# Patient Record
Sex: Female | Born: 1945
Health system: Southern US, Community
[De-identification: ages and names within clinical notes are randomized; demographics above are authoritative.]

## PROBLEM LIST (undated history)

## (undated) DIAGNOSIS — J449 Chronic obstructive pulmonary disease, unspecified: Secondary | ICD-10-CM

## (undated) DIAGNOSIS — E079 Disorder of thyroid, unspecified: Secondary | ICD-10-CM

## (undated) DIAGNOSIS — E039 Hypothyroidism, unspecified: Secondary | ICD-10-CM

## (undated) DIAGNOSIS — F419 Anxiety disorder, unspecified: Secondary | ICD-10-CM

## (undated) DIAGNOSIS — Z9109 Other allergy status, other than to drugs and biological substances: Secondary | ICD-10-CM

## (undated) DIAGNOSIS — J45909 Unspecified asthma, uncomplicated: Secondary | ICD-10-CM

## (undated) HISTORY — PX: NECK SURGERY: SHX720

---

## 2002-02-26 ENCOUNTER — Ambulatory Visit: Admission: RE | Admit: 2002-02-26 | Discharge: 2002-02-26 | Payer: Self-pay | Admitting: Family Medicine

## 2002-02-26 ENCOUNTER — Encounter: Payer: Self-pay | Admitting: Family Medicine

## 2002-07-30 ENCOUNTER — Other Ambulatory Visit: Admission: RE | Admit: 2002-07-30 | Discharge: 2002-07-30 | Payer: Self-pay | Admitting: *Deleted

## 2006-05-03 ENCOUNTER — Ambulatory Visit (HOSPITAL_COMMUNITY): Admission: RE | Admit: 2006-05-03 | Discharge: 2006-05-03 | Payer: Self-pay | Admitting: Family Medicine

## 2006-07-04 ENCOUNTER — Ambulatory Visit (HOSPITAL_COMMUNITY): Admission: RE | Admit: 2006-07-04 | Discharge: 2006-07-05 | Payer: Self-pay | Admitting: Neurosurgery

## 2008-07-22 ENCOUNTER — Ambulatory Visit (HOSPITAL_COMMUNITY): Admission: RE | Admit: 2008-07-22 | Discharge: 2008-07-22 | Payer: Self-pay | Admitting: Family Medicine

## 2010-08-07 ENCOUNTER — Encounter: Payer: Self-pay | Admitting: Otolaryngology

## 2010-08-07 ENCOUNTER — Encounter: Payer: Self-pay | Admitting: Family Medicine

## 2010-12-03 NOTE — Op Note (Signed)
NAMEAMOUR, TRIGG NO.:  [id number]   MEDICAL RECORD NO.:  [id number]          PATIENT TYPE:  OIB   LOCATION:  3033                         FACILITY:  [facility]   PHYSICIAN:  Danae Orleans. Venetia Maxon, M.D.  DATE OF BIRTH:  06/01/1947   DATE OF PROCEDURE:  07/04/2006  DATE OF DISCHARGE:                               OPERATIVE REPORT   PREOPERATIVE DIAGNOSIS:  Herniated cervical disk with spondylosis,  degenerative disk disease, stenosis and radiculopathy C4-5, C5-6, C6-7  levels.   POSTOPERATIVE DIAGNOSIS:  Herniated cervical disk with spondylosis,  degenerative disk disease, stenosis and radiculopathy C4-5, C5-6, C6-7  levels.   PROCEDURE:  Anterior cervical decompression and fusion C4-5, C5-6 and C6-  7 levels. Peak interbody cages, morselized bone autograft and Osteocel  and anterior cervical plate.   SURGEON:  Dr. Venetia Maxon   ASSISTANT:  Hewitt Shorts, M.D. and Poteat RN   ANESTHESIA:  General endotracheal anesthesia.   BLOOD LOSS:  Minimal.   COMPLICATIONS:  None.   DISPOSITION:  Recovery.   INDICATIONS:  Ann Fowler is a 65 year old woman with significant  cervical spondylosis and left greater than right foraminal stenosis at  C4-5, C5-6 and C6-7 levels with severe left arm pain.  It was elected to  take her surgery for anterior cervical decompression and fusion at these  affected levels.   PROCEDURE:  Ann Fowler was brought to the operating room.  Following  satisfactory and uncomplicated induction of general endotracheal  anesthesia placed intravenous lines, the patient was placed in supine  position on the operating table.  Her neck was placed in slight  extension.  She was placed in 10 pounds halter traction.  Her anterior  neck was then prepped and draped usual sterile fashion.  Area of plane  incision was infiltrated with quarter percent Marcaine and 0.5%  lidocaine with 1:200,000 epinephrine.  Incision was made midline  anterior border  sternocleidomastoid muscle on the left side of midline.  Subplatysmal dissection was performed and then blunt dissection was  utilized to keep the carotid sheath lateral and trachea, esophagus  medial exposing the anterior cervical spine.  A bent spinal needle was  placed at what was felt to be the C4-5 level.  This was confirmed on  intraoperative x-ray.  Subsequently longus colli muscles were taken down  from the anterior cervical spine from C4-C7 using electrocautery and  Keel elevator and the self-retaining shadowline retractors placed to  facilitate exposure.  The ventral osteophytes at 6-7 were removed with  Leksell rongeur.  The interspaces at each of the affected levels was  then incised with 15 blade and disk material was removed in piecemeal  fashion.  Endplates were decorticated initially with multiple curettes  and subsequently with high-speed drill and the bone removed was then  retained for use in later bone grafting.  Initially the C6-7 levels  decorticated along the uncinate spurs which were drilled out and  subsequently the C5-6 and C4-5 levels were similarly decompressed. The  disk space spreaders then placed microscope was brought into field and  using initially at the  C6-7 level.  The spinal cord dura and both C7  nerve roots widely decompressed extensive at the neural foramina.  Hemostasis was assured with Gelfoam soaked in thrombin.  After trial  sizing a 6-mm PEEK interbody cage was selected, packed with morcellized  bone autograft and retained from drilling the endplates and also mixed  with 1 mL ostia cell.  The implant was tamped into position countersunk  appropriately.  Attention was then turned to the C4-5 level similar  decompression was performed. Both the neural foramen and central spinal  cord dura were decompressed.  Given the fragility of C5 nerve roots care  was taken not to overly aggressively instrument the neural foramina but  these were decompressed  particularly on the left where there was  significant spondylitic material causing nerve root compression.  After  trial sizing a 6-mm PEEK interbody cage was selected, packed morselized  bone autograft and Osteocel tamped in position and at the C5-6 level  similar decompression was performed. Spinal cord dura and both C6 nerve  roots widely decompressed and similarly sized graft was placed. After  trial sizing a 48 mm anterior cervical plate was then selected and fixed  to the anterior cervical spine using variable angle 14 mm screws, two at  C4, two at C5, two at C6, and two at C7.  All screws had excellent  purchase and locking mechanisms were engaged.  Final x-ray demonstrated  well-positioned interbody grafts and anterior cervical plate.  The C5-6  interspace graft was slightly posterior.  This was brought forward after  re-engaging the graft loading tool and then another x-ray was not  obtained as it was not felt to be necessary. Hemostasis was then assured  and soft tissues were inspected and found to be in good repair.  A #7 JP  drain was placed through separate stab incision. The platysma layer was  closed with 3-0 Vicryl sutures and skin edges were approximated 3-0  Vicryl interrupted inverted sutures.  The wound was dressed with  Benzoin, Steri-Strips, Telfa gauze and tape.  The patient extubated in  operating room and taken to recovery room in stable satisfactory  condition having tolerated operation well.  Counts correct at the end of  case.      Danae Orleans. Venetia Maxon, M.D.  Electronically Signed     JDS/MEDQ  D:  07/04/2006  T:  07/04/2006  Job:  [id number]

## 2011-07-19 ENCOUNTER — Ambulatory Visit (HOSPITAL_COMMUNITY)
Admission: RE | Admit: 2011-07-19 | Discharge: 2011-07-19 | Disposition: A | Discharge status: Home / Self Care | Payer: Medicare Other | Attending: Psychiatry | Admitting: Psychiatry

## 2011-07-19 ENCOUNTER — Emergency Department (HOSPITAL_COMMUNITY): Payer: Medicare Other

## 2011-07-19 ENCOUNTER — Emergency Department (HOSPITAL_COMMUNITY)
Admission: EM | Admit: 2011-07-19 | Discharge: 2011-07-20 | Disposition: A | Discharge status: Home / Self Care | Payer: Medicare Other | Attending: Emergency Medicine | Admitting: Emergency Medicine

## 2011-07-19 DIAGNOSIS — F411 Generalized anxiety disorder: Secondary | ICD-10-CM | POA: Insufficient documentation

## 2011-07-19 DIAGNOSIS — J3489 Other specified disorders of nose and nasal sinuses: Secondary | ICD-10-CM | POA: Diagnosis not present

## 2011-07-19 DIAGNOSIS — F419 Anxiety disorder, unspecified: Secondary | ICD-10-CM

## 2011-07-19 DIAGNOSIS — F39 Unspecified mood [affective] disorder: Secondary | ICD-10-CM | POA: Diagnosis not present

## 2011-07-19 DIAGNOSIS — R443 Hallucinations, unspecified: Secondary | ICD-10-CM | POA: Insufficient documentation

## 2011-07-19 DIAGNOSIS — F29 Unspecified psychosis not due to a substance or known physiological condition: Secondary | ICD-10-CM | POA: Insufficient documentation

## 2011-07-19 HISTORY — DX: Anxiety disorder, unspecified: F41.9

## 2011-07-19 HISTORY — DX: Other allergy status, other than to drugs and biological substances: Z91.09

## 2011-07-19 HISTORY — DX: Disorder of thyroid, unspecified: E07.9

## 2011-07-19 LAB — BASIC METABOLIC PANEL
BUN: 14 mg/dL (ref 6–23)
Calcium: 9.6 mg/dL (ref 8.4–10.5)
GFR calc Af Amer: 86 mL/min — ABNORMAL LOW (ref >90)
GFR calc non Af Amer: 75 mL/min — ABNORMAL LOW (ref >90)
Glucose, Bld: 105 mg/dL — ABNORMAL HIGH (ref 70–99)
Potassium: 3.5 mEq/L (ref 3.5–5.1)

## 2011-07-19 LAB — CBC
MCH: 31.9 pg (ref 26.0–34.0)
MCHC: 33.7 g/dL (ref 30.0–36.0)
MCV: 94.7 fL (ref 78.0–100.0)
Platelets: 311 10*3/uL (ref 150–400)
RBC: 4.76 MIL/uL (ref 3.87–5.11)
RDW: 13.5 % (ref 11.5–15.5)

## 2011-07-19 LAB — DIFFERENTIAL
Basophils Absolute: 0.1 10*3/uL (ref 0.0–0.1)
Basophils Relative: 0 % (ref 0–1)
Eosinophils Absolute: 0.2 10*3/uL (ref 0.0–0.7)
Eosinophils Relative: 1 % (ref 0–5)
Lymphs Abs: 2.2 10*3/uL (ref 0.7–4.0)
Neutrophils Relative %: 70 % (ref 43–77)

## 2011-07-19 LAB — RAPID URINE DRUG SCREEN, HOSP PERFORMED
Cocaine: NOT DETECTED
Opiates: NOT DETECTED
Tetrahydrocannabinol: NOT DETECTED

## 2011-07-19 MED ORDER — AZITHROMYCIN 250 MG PO TABS: ORAL_TABLET | ORAL | Status: DC

## 2011-07-19 MED ORDER — ALPRAZOLAM 1 MG PO TABS: 1.0000 mg | ORAL_TABLET | Freq: Every evening | ORAL | Status: AC | PRN

## 2011-07-19 MED ORDER — FLUTICASONE PROPIONATE 50 MCG/ACT NA SUSP: 2.0000 | Freq: Every day | NASAL | Status: DC

## 2011-07-19 NOTE — ED Notes (Signed)
Pt transferred from TCU 

## 2011-07-19 NOTE — ED Notes (Signed)
Pt states she is having issues with anxiety, ears ring and feel full, feels like she is hearing voices, shocking feeling at intervals, voices are not telling pt to hurt self, thought she heard foot steps upstairs, tried to wake husband, pt was afraid so she went outside and ran across yards and into woods, event this am.

## 2011-07-19 NOTE — ED Notes (Signed)
Pt coming from [facility], needs med clearance and ACT team to see

## 2011-07-19 NOTE — ED Provider Notes (Signed)
History     CSN: [id number]  Arrival date & time 07/19/11  1548   First MD Initiated Contact with Patient 07/19/11 1854     HPI  Patient reports her voice for last 2 months. Reports her voice is loud and clear and will tell her to do things. Denies however telling acute violent things. Denies suicidal ideation or homicidal ideation. Patient reports she's been under significant amount of stress over the last 3 years. As well as anxiety. Denies history of hallucinations or mental disorder. Denies family history of behavioral disorders. Denies family history of Alzheimer's or dementia. Denies substance abuse besides prescription Xanax. Does request a prescription Xanax since she is almost out of her Xanax. Also requests a Z-Pak do to a sinus infection for one month. Reports sinus infection appears to be left-sided. Describes infection as pressure type headaches, nasal congestion, and rhinorrhea. Denies fever, cough, sore throat.    Past Medical History  Diagnosis Date  . Anxiety   . Thyroid disease   . Allergy to environmental factors     Past Surgical History  Procedure Date  . Neck surgery     No family history on file.  History  Substance Use Topics  . Smoking status: Never Smoker   . Smokeless tobacco: Not on file  . Alcohol Use: Yes    OB History    Grav Para Term Preterm Abortions TAB SAB Ect Mult Living                  Review of Systems  Constitutional: Negative for fever.  HENT: Positive for congestion, rhinorrhea and sinus pressure. Negative for sore throat.   Respiratory: Negative for cough and shortness of breath.   Cardiovascular: Negative for chest pain.  Psychiatric/Behavioral: Positive for hallucinations and behavioral problems. Negative for suicidal ideas and self-injury.  All other systems reviewed and are negative.    Allergies  Review of patient's allergies indicates no known allergies.  Home Medications   Current Outpatient Rx  Name Route Sig  Dispense Refill  . ALPRAZOLAM 1 MG PO TABS Oral Take 1 mg by mouth at bedtime.      Marland Kitchen BIOTIN 5 MG PO CAPS Oral Take 2 capsules by mouth daily.      Marland Kitchen VITAMIN D 1000 UNITS PO TABS Oral Take 5,000 Units by mouth daily.      . CYANOCOBALAMIN IJ Injection Inject 1,000 mcg as directed every 30 (thirty) days.      Marland Kitchen DIPHENHYDRAMINE HCL 25 MG PO TABS Oral Take 50 mg by mouth at bedtime.      Marland Kitchen LEVOTHYROXINE SODIUM 88 MCG PO TABS Oral Take 88 mcg by mouth daily.      . CENTRUM PO Oral Take 1 tablet by mouth daily.      Marland Kitchen OVER THE COUNTER MEDICATION Oral Take 2 tablets by mouth at bedtime.        BP 113/90  Pulse 85  Temp(Src) 99.4 F (37.4 C) (Oral)  Resp 16  SpO2 97%  Physical Exam  Vitals reviewed. Constitutional: She is oriented to person, place, and time. Vital signs are normal. She appears well-developed and well-nourished.  HENT:  Head: Normocephalic and atraumatic.  Right Ear: External ear normal.  Left Ear: External ear normal.  Nose: Nose normal.  Mouth/Throat: Oropharynx is clear and moist. No oropharyngeal exudate.  Eyes: Conjunctivae are normal. Pupils are equal, round, and reactive to light.  Neck: Normal range of motion. Neck supple.  Cardiovascular: Normal rate,  regular rhythm and normal heart sounds.  Exam reveals no friction rub.   No murmur heard. Pulmonary/Chest: Effort normal and breath sounds normal. She has no wheezes. She has no rhonchi. She has no rales. She exhibits no tenderness.  Musculoskeletal: Normal range of motion.  Neurological: She is alert and oriented to person, place, and time. Coordination normal.  Skin: Skin is warm and dry. No rash noted. No erythema. No pallor.  Psychiatric: Her behavior is normal.    ED Course  Procedures (including critical care time)  Labs Reviewed  BASIC METABOLIC PANEL - Abnormal; Notable for the following:    Glucose, Bld 105 (*)    GFR calc non Af Amer 75 (*)    GFR calc Af Amer 86 (*)    All other components  within normal limits  CBC - Abnormal; Notable for the following:    WBC 11.8 (*)    Hemoglobin 15.2 (*)    All other components within normal limits  DIFFERENTIAL - Abnormal; Notable for the following:    Neutro Abs 8.3 (*)    All other components within normal limits  URINE RAPID DRUG SCREEN (HOSP PERFORMED) - Abnormal; Notable for the following:    Benzodiazepines POSITIVE (*)    Barbiturates POSITIVE (*)    All other components within normal limits  ACETAMINOPHEN LEVEL  ETHANOL   Ct Head Wo Contrast  07/19/2011  *RADIOLOGY REPORT*  Clinical Data: New onset of hallucinations.  CT HEAD WITHOUT CONTRAST  Technique:  Contiguous axial images were obtained from the base of the skull through the vertex without contrast.  Comparison: None.  Findings: There is no intra or extra-axial fluid collection or mass lesion.  The basilar cisterns and ventricles have a normal appearance.  There is no CT evidence for acute infarction or hemorrhage.  Bone windows show no calvarial fracture or sinus abnormality.  IMPRESSION: Negative exam.  Original Report Authenticated By: Patterson Hammersmith, M.D.     No diagnosis found.    MDM   Discuss outpatient psychiatric referrals with patient and family. However patient was recommended to have tele psych despite being medically cleared here in the ED. Discussed that this may be a few more hours waitt. Patient and family understand and agree with this.        Thomasene Lot, [place] 07/19/11 2108
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
## 2011-07-19 NOTE — ED Provider Notes (Signed)
Medical screening examination/treatment/procedure(s) were performed by non-physician practitioner and as supervising physician I was immediately available for consultation/collaboration.    Patient resting comfortably.  Is awaiting telepsyh. consult.  Nelia Shi, MD 07/19/11 2238
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
## 2011-07-20 LAB — T4, FREE: Free T4: 1.38 ng/dL (ref 0.80–1.80)

## 2011-07-20 MED ORDER — PANTOPRAZOLE SODIUM 40 MG PO TBEC
40.0000 mg | DELAYED_RELEASE_TABLET | Freq: Once | ORAL | Status: AC
  Administered 2011-07-20: 40 mg via ORAL
  Filled 2011-07-20: qty 1

## 2011-07-20 MED ORDER — QUETIAPINE FUMARATE 25 MG PO TABS: 25.0000 mg | ORAL_TABLET | Freq: Every day | ORAL | Status: AC

## 2011-07-20 MED ORDER — CALCIUM CARBONATE ANTACID 500 MG PO CHEW
1.0000 | CHEWABLE_TABLET | Freq: Once | ORAL | Status: AC
  Administered 2011-07-20: 200 mg via ORAL
  Filled 2011-07-20: qty 1

## 2011-07-20 MED ORDER — ALPRAZOLAM 1 MG PO TABS
1.0000 mg | ORAL_TABLET | Freq: Once | ORAL | Status: AC
  Administered 2011-07-20: 1 mg via ORAL
  Filled 2011-07-20: qty 1

## 2011-07-20 MED ORDER — ESCITALOPRAM OXALATE 10 MG PO TABS: ORAL_TABLET | ORAL | Status: DC

## 2011-07-20 NOTE — ED Provider Notes (Signed)
Medical screening examination/treatment/procedure(s) were performed by non-physician practitioner and as supervising physician I was immediately available for consultation/collaboration.    Danaisha Celli L Ceola Para, MD 07/20/11 1325 

## 2011-07-20 NOTE — ED Notes (Signed)
Pt asks repeatedly whether the doctor is coming to discharge her. Writer has explained multiple times that psychiatric consult will be faxed with recommendations to ED doctor. Astronomer for delay. EDP is aware of pt requests and is anticipating fax.

## 2011-07-20 NOTE — ED Provider Notes (Addendum)
Signed out by Dr Radford Pax that pt likley can be discharged home after telepsych consult.  telepsych consult completed, psych MD recommends d/c home with rx for seroquel 25 mg qhs and lexapro 10 mg qam. Pt awake and alert. Normal mood/affect. No hallucinations/delusions. States is ready to go home, optimistic regarding plan.   Suzi Roots, MD 07/20/11 1610  Suzi Roots, MD 07/20/11 [phone]

## 2011-07-21 DIAGNOSIS — F29 Unspecified psychosis not due to a substance or known physiological condition: Secondary | ICD-10-CM | POA: Diagnosis not present

## 2011-07-21 DIAGNOSIS — F39 Unspecified mood [affective] disorder: Secondary | ICD-10-CM | POA: Diagnosis not present

## 2011-08-04 DIAGNOSIS — T1500XA Foreign body in cornea, unspecified eye, initial encounter: Secondary | ICD-10-CM | POA: Diagnosis not present

## 2011-08-04 DIAGNOSIS — H2510 Age-related nuclear cataract, unspecified eye: Secondary | ICD-10-CM | POA: Diagnosis not present

## 2011-08-04 DIAGNOSIS — G43109 Migraine with aura, not intractable, without status migrainosus: Secondary | ICD-10-CM | POA: Diagnosis not present

## 2011-09-02 DIAGNOSIS — E039 Hypothyroidism, unspecified: Secondary | ICD-10-CM | POA: Diagnosis not present

## 2011-09-02 DIAGNOSIS — L659 Nonscarring hair loss, unspecified: Secondary | ICD-10-CM | POA: Diagnosis not present

## 2011-09-14 DIAGNOSIS — J3089 Other allergic rhinitis: Secondary | ICD-10-CM | POA: Diagnosis not present

## 2011-09-14 DIAGNOSIS — B999 Unspecified infectious disease: Secondary | ICD-10-CM | POA: Diagnosis not present

## 2011-09-14 DIAGNOSIS — J45909 Unspecified asthma, uncomplicated: Secondary | ICD-10-CM | POA: Diagnosis not present

## 2011-09-14 DIAGNOSIS — J019 Acute sinusitis, unspecified: Secondary | ICD-10-CM | POA: Diagnosis not present

## 2011-10-02 DIAGNOSIS — J019 Acute sinusitis, unspecified: Secondary | ICD-10-CM | POA: Diagnosis not present

## 2011-10-02 DIAGNOSIS — R197 Diarrhea, unspecified: Secondary | ICD-10-CM | POA: Diagnosis not present

## 2011-10-07 DIAGNOSIS — N814 Uterovaginal prolapse, unspecified: Secondary | ICD-10-CM | POA: Diagnosis not present

## 2011-10-07 DIAGNOSIS — E039 Hypothyroidism, unspecified: Secondary | ICD-10-CM | POA: Diagnosis not present

## 2011-10-10 DIAGNOSIS — R351 Nocturia: Secondary | ICD-10-CM | POA: Diagnosis not present

## 2011-10-10 DIAGNOSIS — R339 Retention of urine, unspecified: Secondary | ICD-10-CM | POA: Diagnosis not present

## 2011-10-10 DIAGNOSIS — N8111 Cystocele, midline: Secondary | ICD-10-CM | POA: Diagnosis not present

## 2011-11-23 DIAGNOSIS — R5383 Other fatigue: Secondary | ICD-10-CM | POA: Diagnosis not present

## 2011-11-23 DIAGNOSIS — R5381 Other malaise: Secondary | ICD-10-CM | POA: Diagnosis not present

## 2011-11-23 DIAGNOSIS — L658 Other specified nonscarring hair loss: Secondary | ICD-10-CM | POA: Diagnosis not present

## 2011-11-25 DIAGNOSIS — J069 Acute upper respiratory infection, unspecified: Secondary | ICD-10-CM | POA: Diagnosis not present

## 2011-11-25 DIAGNOSIS — J309 Allergic rhinitis, unspecified: Secondary | ICD-10-CM | POA: Diagnosis not present

## 2012-02-20 DIAGNOSIS — S92309A Fracture of unspecified metatarsal bone(s), unspecified foot, initial encounter for closed fracture: Secondary | ICD-10-CM | POA: Diagnosis not present

## 2012-02-20 DIAGNOSIS — M79609 Pain in unspecified limb: Secondary | ICD-10-CM | POA: Diagnosis not present

## 2012-03-12 DIAGNOSIS — S92309A Fracture of unspecified metatarsal bone(s), unspecified foot, initial encounter for closed fracture: Secondary | ICD-10-CM | POA: Diagnosis not present

## 2012-04-30 DIAGNOSIS — S90129A Contusion of unspecified lesser toe(s) without damage to nail, initial encounter: Secondary | ICD-10-CM | POA: Diagnosis not present

## 2012-05-14 DIAGNOSIS — Z23 Encounter for immunization: Secondary | ICD-10-CM | POA: Diagnosis not present

## 2012-05-21 DIAGNOSIS — IMO0002 Reserved for concepts with insufficient information to code with codable children: Secondary | ICD-10-CM | POA: Diagnosis not present

## 2012-05-21 DIAGNOSIS — E039 Hypothyroidism, unspecified: Secondary | ICD-10-CM | POA: Diagnosis not present

## 2012-05-21 DIAGNOSIS — S93409A Sprain of unspecified ligament of unspecified ankle, initial encounter: Secondary | ICD-10-CM | POA: Diagnosis not present

## 2012-06-06 ENCOUNTER — Ambulatory Visit (HOSPITAL_COMMUNITY)
Admission: RE | Admit: 2012-06-06 | Discharge: 2012-06-06 | Disposition: A | Discharge status: Home / Self Care | Payer: Medicare Other | Source: Ambulatory Visit | Attending: Internal Medicine | Admitting: Internal Medicine

## 2012-06-06 ENCOUNTER — Other Ambulatory Visit (HOSPITAL_COMMUNITY): Payer: Self-pay | Admitting: Internal Medicine

## 2012-06-06 DIAGNOSIS — S93429A Sprain of deltoid ligament of unspecified ankle, initial encounter: Secondary | ICD-10-CM

## 2012-06-06 DIAGNOSIS — S93409A Sprain of unspecified ligament of unspecified ankle, initial encounter: Secondary | ICD-10-CM | POA: Diagnosis not present

## 2012-06-06 DIAGNOSIS — X58XXXA Exposure to other specified factors, initial encounter: Secondary | ICD-10-CM | POA: Insufficient documentation

## 2012-08-06 ENCOUNTER — Other Ambulatory Visit: Payer: Self-pay | Admitting: Internal Medicine

## 2012-08-06 DIAGNOSIS — Z1231 Encounter for screening mammogram for malignant neoplasm of breast: Secondary | ICD-10-CM

## 2012-09-19 ENCOUNTER — Ambulatory Visit
Admission: RE | Admit: 2012-09-19 | Discharge: 2012-09-19 | Disposition: A | Discharge status: Home / Self Care | Payer: Medicare Other | Source: Ambulatory Visit | Attending: Internal Medicine | Admitting: Internal Medicine

## 2012-09-19 DIAGNOSIS — Z1231 Encounter for screening mammogram for malignant neoplasm of breast: Secondary | ICD-10-CM

## 2013-06-20 ENCOUNTER — Emergency Department (HOSPITAL_COMMUNITY)
Admission: EM | Admit: 2013-06-20 | Discharge: 2013-06-20 | Disposition: A | Discharge status: Home / Self Care | Payer: Medicare Other | Attending: Emergency Medicine | Admitting: Emergency Medicine

## 2013-06-20 ENCOUNTER — Encounter (HOSPITAL_COMMUNITY): Payer: Self-pay | Admitting: Emergency Medicine

## 2013-06-20 ENCOUNTER — Emergency Department (HOSPITAL_COMMUNITY): Payer: Medicare Other

## 2013-06-20 DIAGNOSIS — J209 Acute bronchitis, unspecified: Secondary | ICD-10-CM

## 2013-06-20 DIAGNOSIS — E079 Disorder of thyroid, unspecified: Secondary | ICD-10-CM | POA: Insufficient documentation

## 2013-06-20 DIAGNOSIS — Z79899 Other long term (current) drug therapy: Secondary | ICD-10-CM | POA: Insufficient documentation

## 2013-06-20 DIAGNOSIS — F411 Generalized anxiety disorder: Secondary | ICD-10-CM | POA: Insufficient documentation

## 2013-06-20 DIAGNOSIS — IMO0002 Reserved for concepts with insufficient information to code with codable children: Secondary | ICD-10-CM | POA: Insufficient documentation

## 2013-06-20 LAB — BASIC METABOLIC PANEL
CO2: 28 mEq/L (ref 19–32)
Chloride: 100 mEq/L (ref 96–112)
Creatinine, Ser: 0.73 mg/dL (ref 0.50–1.10)
Glucose, Bld: 128 mg/dL — ABNORMAL HIGH (ref 70–99)
Sodium: 140 mEq/L (ref 135–145)

## 2013-06-20 LAB — CBC WITH DIFFERENTIAL/PLATELET
Basophils Absolute: 0 10*3/uL (ref 0.0–0.1)
HCT: 48.6 % — ABNORMAL HIGH (ref 36.0–46.0)
Lymphocytes Relative: 20 % (ref 12–46)
Lymphs Abs: 2.3 10*3/uL (ref 0.7–4.0)
MCV: 94.6 fL (ref 78.0–100.0)
Monocytes Absolute: 1 10*3/uL (ref 0.1–1.0)
Neutro Abs: 7.6 10*3/uL (ref 1.7–7.7)
RBC: 5.14 MIL/uL — ABNORMAL HIGH (ref 3.87–5.11)
RDW: 13.4 % (ref 11.5–15.5)
WBC: 11.5 10*3/uL — ABNORMAL HIGH (ref 4.0–10.5)

## 2013-06-20 MED ORDER — METHYLPREDNISOLONE SODIUM SUCC 125 MG IJ SOLR
125.0000 mg | Freq: Once | INTRAMUSCULAR | Status: AC
  Administered 2013-06-20: 125 mg via INTRAVENOUS
  Filled 2013-06-20: qty 2

## 2013-06-20 MED ORDER — ALBUTEROL SULFATE HFA 108 (90 BASE) MCG/ACT IN AERS: 2.0000 | INHALATION_SPRAY | RESPIRATORY_TRACT | Status: DC | PRN

## 2013-06-20 MED ORDER — SODIUM CHLORIDE 0.9 % IV SOLN
INTRAVENOUS | Status: DC
  Administered 2013-06-20: 06:00:00 via INTRAVENOUS

## 2013-06-20 MED ORDER — IPRATROPIUM BROMIDE 0.02 % IN SOLN
0.5000 mg | Freq: Once | RESPIRATORY_TRACT | Status: AC
  Administered 2013-06-20: 0.5 mg via RESPIRATORY_TRACT
  Filled 2013-06-20: qty 2.5

## 2013-06-20 MED ORDER — LEVOFLOXACIN 500 MG PO TABS: 500.0000 mg | ORAL_TABLET | Freq: Every day | ORAL | Status: DC

## 2013-06-20 MED ORDER — METHYLPREDNISOLONE SODIUM SUCC 125 MG IJ SOLR: 125.0000 mg | Freq: Once | INTRAMUSCULAR | Status: DC

## 2013-06-20 MED ORDER — ALBUTEROL SULFATE (5 MG/ML) 0.5% IN NEBU
5.0000 mg | INHALATION_SOLUTION | Freq: Once | RESPIRATORY_TRACT | Status: AC
  Administered 2013-06-20: 5 mg via RESPIRATORY_TRACT
  Filled 2013-06-20: qty 1

## 2013-06-20 MED ORDER — ALBUTEROL (5 MG/ML) CONTINUOUS INHALATION SOLN
10.0000 mg/h | INHALATION_SOLUTION | Freq: Once | RESPIRATORY_TRACT | Status: AC
  Administered 2013-06-20: 10 mg/h via RESPIRATORY_TRACT
  Filled 2013-06-20: qty 20

## 2013-06-20 MED ORDER — PREDNISONE 50 MG PO TABS: 50.0000 mg | ORAL_TABLET | Freq: Every day | ORAL | Status: DC

## 2013-06-20 MED ORDER — DOXYCYCLINE HYCLATE 100 MG PO CAPS: 100.0000 mg | ORAL_CAPSULE | Freq: Two times a day (BID) | ORAL | Status: DC

## 2013-06-20 NOTE — ED Notes (Signed)
Patient c/o shortness of breath since yesterday, but states has gotten worse during the night.

## 2013-06-20 NOTE — ED Provider Notes (Signed)
CSN: [id number]     Arrival date & time 06/20/13  0543 History   First MD Initiated Contact with Patient 06/20/13 [phone]     Chief Complaint  Patient presents with  . Shortness of Breath  . Cough   (Consider location/radiation/quality/duration/timing/severity/associated sxs/prior Treatment) HPI  Patient reports she started getting short of breath yesterday with wheezing. She states she has had a cough and some green sputum production but denies fever. She states she has had some chills. She denies sore throat or rhinorrhea but has had some sneezing. She has DOE. She states she's used an inhaler in the past but it was at least 10 years ago. She denies being around anybody else who is sick.   PCP Dr Sherwood Gambler  Past Medical History  Diagnosis Date  . Anxiety   . Thyroid disease   . Allergy to environmental factors    Past Surgical History  Procedure Laterality Date  . Neck surgery     No family history on file. History  Substance Use Topics  . Smoking status: Never Smoker   . Smokeless tobacco: Not on file  . Alcohol Use: Yes   Lives at home Lives with spouse Never exposed to second hand smoke  OB History   Grav Para Term Preterm Abortions TAB SAB Ect Mult Living                 Review of Systems  All other systems reviewed and are negative.    Allergies  Review of patient's allergies indicates no known allergies.  Home Medications   Current Outpatient Rx  Name  Route  Sig  Dispense  Refill  . ALPRAZolam (XANAX) 1 MG tablet   Oral   Take 1 mg by mouth at bedtime.           . Biotin (BIOTIN 5000) 5 MG CAPS   Oral   Take 2 capsules by mouth daily.           . cholecalciferol (VITAMIN D) 1000 UNITS tablet   Oral   Take 5,000 Units by mouth daily.           . CYANOCOBALAMIN IJ   Injection   Inject 1,000 mcg as directed every 30 (thirty) days.           . diphenhydrAMINE (BENADRYL) 25 MG tablet   Oral   Take 50 mg by mouth at bedtime.           Marland Kitchen  escitalopram (LEXAPRO) 10 MG tablet      Take one (1) po once a day, in the morning   30 tablet   0   . EXPIRED: fluticasone (FLONASE) 50 MCG/ACT nasal spray   Nasal   Place 2 sprays into the nose daily.   16 g   2   . levothyroxine (SYNTHROID, LEVOTHROID) 88 MCG tablet   Oral   Take 88 mcg by mouth daily.           . Multiple Vitamins-Minerals (CENTRUM PO)   Oral   Take 1 tablet by mouth daily.           Marland Kitchen OVER THE COUNTER MEDICATION   Oral   Take 2 tablets by mouth at bedtime.            BP 157/90  Pulse 103  Temp(Src) 97.5 F (36.4 C) (Oral)  Resp 36  SpO2 97%  Vital signs normal except tachycardia, tachypnea   Physical Exam  Nursing note and vitals  reviewed. Constitutional: She is oriented to person, place, and time. She appears well-developed and well-nourished.  Non-toxic appearance. She does not appear ill. She appears distressed.  HENT:  Head: Normocephalic and atraumatic.  Right Ear: External ear normal.  Left Ear: External ear normal.  Nose: Nose normal. No mucosal edema or rhinorrhea.  Mouth/Throat: Oropharynx is clear and moist and mucous membranes are normal. No dental abscesses or uvula swelling.  Eyes: Conjunctivae and EOM are normal. Pupils are equal, round, and reactive to light.  Neck: Normal range of motion and full passive range of motion without pain. Neck supple.  Cardiovascular: Normal rate, regular rhythm and normal heart sounds.  Exam reveals no gallop and no friction rub.   No murmur heard. Pulmonary/Chest: Accessory muscle usage present. Tachypnea noted. She is in respiratory distress. She has decreased breath sounds. She has wheezes. She has no rhonchi. She has no rales. She exhibits no tenderness and no crepitus.  Some audible wheezing, tight cough  Abdominal: Soft. Normal appearance and bowel sounds are normal. She exhibits no distension. There is no tenderness. There is no rebound and no guarding.  Musculoskeletal: Normal range of  motion. She exhibits no edema and no tenderness.  Moves all extremities well.   Neurological: She is alert and oriented to person, place, and time. She has normal strength. No cranial nerve deficit.  Skin: Skin is warm, dry and intact. No rash noted. No erythema. No pallor.  Psychiatric: Her speech is normal and behavior is normal. Her mood appears anxious.    ED Course  Procedures (including critical care time)  Medications  0.9 %  sodium chloride infusion ( Intravenous New Bag/Given 06/20/13 0627)  albuterol (PROVENTIL) (5 MG/ML) 0.5% nebulizer solution 5 mg (5 mg Nebulization Given 06/20/13 0600)  ipratropium (ATROVENT) nebulizer solution 0.5 mg (0.5 mg Nebulization Given 06/20/13 0600)  albuterol (PROVENTIL,VENTOLIN) solution continuous neb (10 mg/hr Nebulization Given 06/20/13 0608)  ipratropium (ATROVENT) nebulizer solution 0.5 mg (0.5 mg Nebulization Given 06/20/13 0608)  methylPREDNISolone sodium succinate (SOLU-MEDROL) 125 mg/2 mL injection 125 mg (125 mg Intravenous Given 06/20/13 0627)    Recheck 06:48 breathing easier, now able to talk in sentences and is smiling. Has improved air movement with scattered wheezing and rhonchi (rare). Still finishing her continuous nebulizer.   Pt turned over at change of shift to Dr Wilkie Aye to finish her evaluation (CXR result, ambulate with pulse ox).   Labs Review Results for orders placed during the hospital encounter of 06/20/13  CBC WITH DIFFERENTIAL      Result Value Range   WBC 11.5 (*) 4.0 - 10.5 K/uL   RBC 5.14 (*) 3.87 - 5.11 MIL/uL   Hemoglobin 16.3 (*) 12.0 - 15.0 g/dL   HCT 16.1 (*) 09.6 - 04.5 %   MCV 94.6  78.0 - 100.0 fL   MCH 31.7  26.0 - 34.0 pg   MCHC 33.5  30.0 - 36.0 g/dL   RDW 40.9  81.1 - 91.4 %   Platelets 256  150 - 400 K/uL   Neutrophils Relative % 66  43 - 77 %   Neutro Abs 7.6  1.7 - 7.7 K/uL   Lymphocytes Relative 20  12 - 46 %   Lymphs Abs 2.3  0.7 - 4.0 K/uL   Monocytes Relative 8  3 - 12 %   Monocytes  Absolute 1.0  0.1 - 1.0 K/uL   Eosinophils Relative 6 (*) 0 - 5 %   Eosinophils Absolute 0.6  0.0 - 0.7  K/uL   Basophils Relative 0  0 - 1 %   Basophils Absolute 0.0  0.0 - 0.1 K/uL  BASIC METABOLIC PANEL      Result Value Range   Sodium 140  135 - 145 mEq/L   Potassium 3.6  3.5 - 5.1 mEq/L   Chloride 100  96 - 112 mEq/L   CO2 28  19 - 32 mEq/L   Glucose, Bld 128 (*) 70 - 99 mg/dL   BUN 9  6 - 23 mg/dL   Creatinine, Ser 8.11  0.50 - 1.10 mg/dL   Calcium 91.4  8.4 - 78.2 mg/dL   GFR calc non Af Amer 86 (*) >90 mL/min   GFR calc Af Amer >90  >90 mL/min   Laboratory interpretation all normal except leukocytosis, concentrated hemoglobin   Imaging Review No results found. CXR pending  EKG Interpretation   None       MDM   1. Bronchospasm with bronchitis, acute      Disposition pending.   Devoria Albe, MD, Armando Gang   Ward Givens, MD 06/20/13 [phone]

## 2013-06-20 NOTE — ED Provider Notes (Addendum)
8:48 AM  I received this patient in signout from Dr. Lynelle Doctor. I have reexamined the patient following nebulization treatment. She continues to have scant wheezing in the left upper lobe. She is nontoxic and not tachypneic. Patient was angulated with pulse ox and maintained her O2 saturations. She does endorse a history of asthma. Patient will be discharged home with prednisone, albuterol, and doxycycline.  After history, exam, and medical workup I feel the patient has been appropriately medically screened and is safe for discharge home. Pertinent diagnoses were discussed with the patient. Patient was given return precautions.   Shon Baton, MD 06/20/13 1610  Shon Baton, MD 06/20/13 [phone]

## 2013-06-20 NOTE — ED Notes (Signed)
Pt states she feels a little bit better.  Continuous neb finished.  Room air sat 93-94%.   Placed on 2 liters [place].

## 2013-06-20 NOTE — ED Notes (Signed)
RT paged for neb treatments

## 2013-07-24 DIAGNOSIS — D235 Other benign neoplasm of skin of trunk: Secondary | ICD-10-CM | POA: Diagnosis not present

## 2013-07-24 DIAGNOSIS — L821 Other seborrheic keratosis: Secondary | ICD-10-CM | POA: Diagnosis not present

## 2013-07-24 DIAGNOSIS — D485 Neoplasm of uncertain behavior of skin: Secondary | ICD-10-CM | POA: Diagnosis not present

## 2013-07-24 DIAGNOSIS — D239 Other benign neoplasm of skin, unspecified: Secondary | ICD-10-CM | POA: Diagnosis not present

## 2013-10-14 DIAGNOSIS — Z79899 Other long term (current) drug therapy: Secondary | ICD-10-CM | POA: Diagnosis not present

## 2013-10-14 DIAGNOSIS — J309 Allergic rhinitis, unspecified: Secondary | ICD-10-CM | POA: Diagnosis not present

## 2013-10-14 DIAGNOSIS — E039 Hypothyroidism, unspecified: Secondary | ICD-10-CM | POA: Diagnosis not present

## 2013-10-14 DIAGNOSIS — IMO0002 Reserved for concepts with insufficient information to code with codable children: Secondary | ICD-10-CM | POA: Diagnosis not present

## 2013-11-18 DIAGNOSIS — J301 Allergic rhinitis due to pollen: Secondary | ICD-10-CM | POA: Diagnosis not present

## 2013-11-18 DIAGNOSIS — E538 Deficiency of other specified B group vitamins: Secondary | ICD-10-CM | POA: Diagnosis not present

## 2013-11-18 DIAGNOSIS — H6690 Otitis media, unspecified, unspecified ear: Secondary | ICD-10-CM | POA: Diagnosis not present

## 2013-11-18 DIAGNOSIS — IMO0002 Reserved for concepts with insufficient information to code with codable children: Secondary | ICD-10-CM | POA: Diagnosis not present

## 2013-11-18 DIAGNOSIS — J309 Allergic rhinitis, unspecified: Secondary | ICD-10-CM | POA: Diagnosis not present

## 2013-11-18 DIAGNOSIS — Z23 Encounter for immunization: Secondary | ICD-10-CM | POA: Diagnosis not present

## 2013-11-18 DIAGNOSIS — E039 Hypothyroidism, unspecified: Secondary | ICD-10-CM | POA: Diagnosis not present

## 2014-05-29 DIAGNOSIS — Z124 Encounter for screening for malignant neoplasm of cervix: Secondary | ICD-10-CM | POA: Diagnosis not present

## 2014-05-29 DIAGNOSIS — Z01419 Encounter for gynecological examination (general) (routine) without abnormal findings: Secondary | ICD-10-CM | POA: Diagnosis not present

## 2014-06-16 DIAGNOSIS — J019 Acute sinusitis, unspecified: Secondary | ICD-10-CM | POA: Diagnosis not present

## 2014-06-16 DIAGNOSIS — Z6822 Body mass index (BMI) 22.0-22.9, adult: Secondary | ICD-10-CM | POA: Diagnosis not present

## 2015-01-10 IMAGING — CR DG CHEST 2V
2 series · 2 of 2 positions shown · non-contrast
Comparison: Chest x-ray of July 22, 2008.

CLINICAL DATA: Shortness of breath which is worsening.

EXAM:
CHEST  2 VIEW

[view not recorded (1 of 2)]
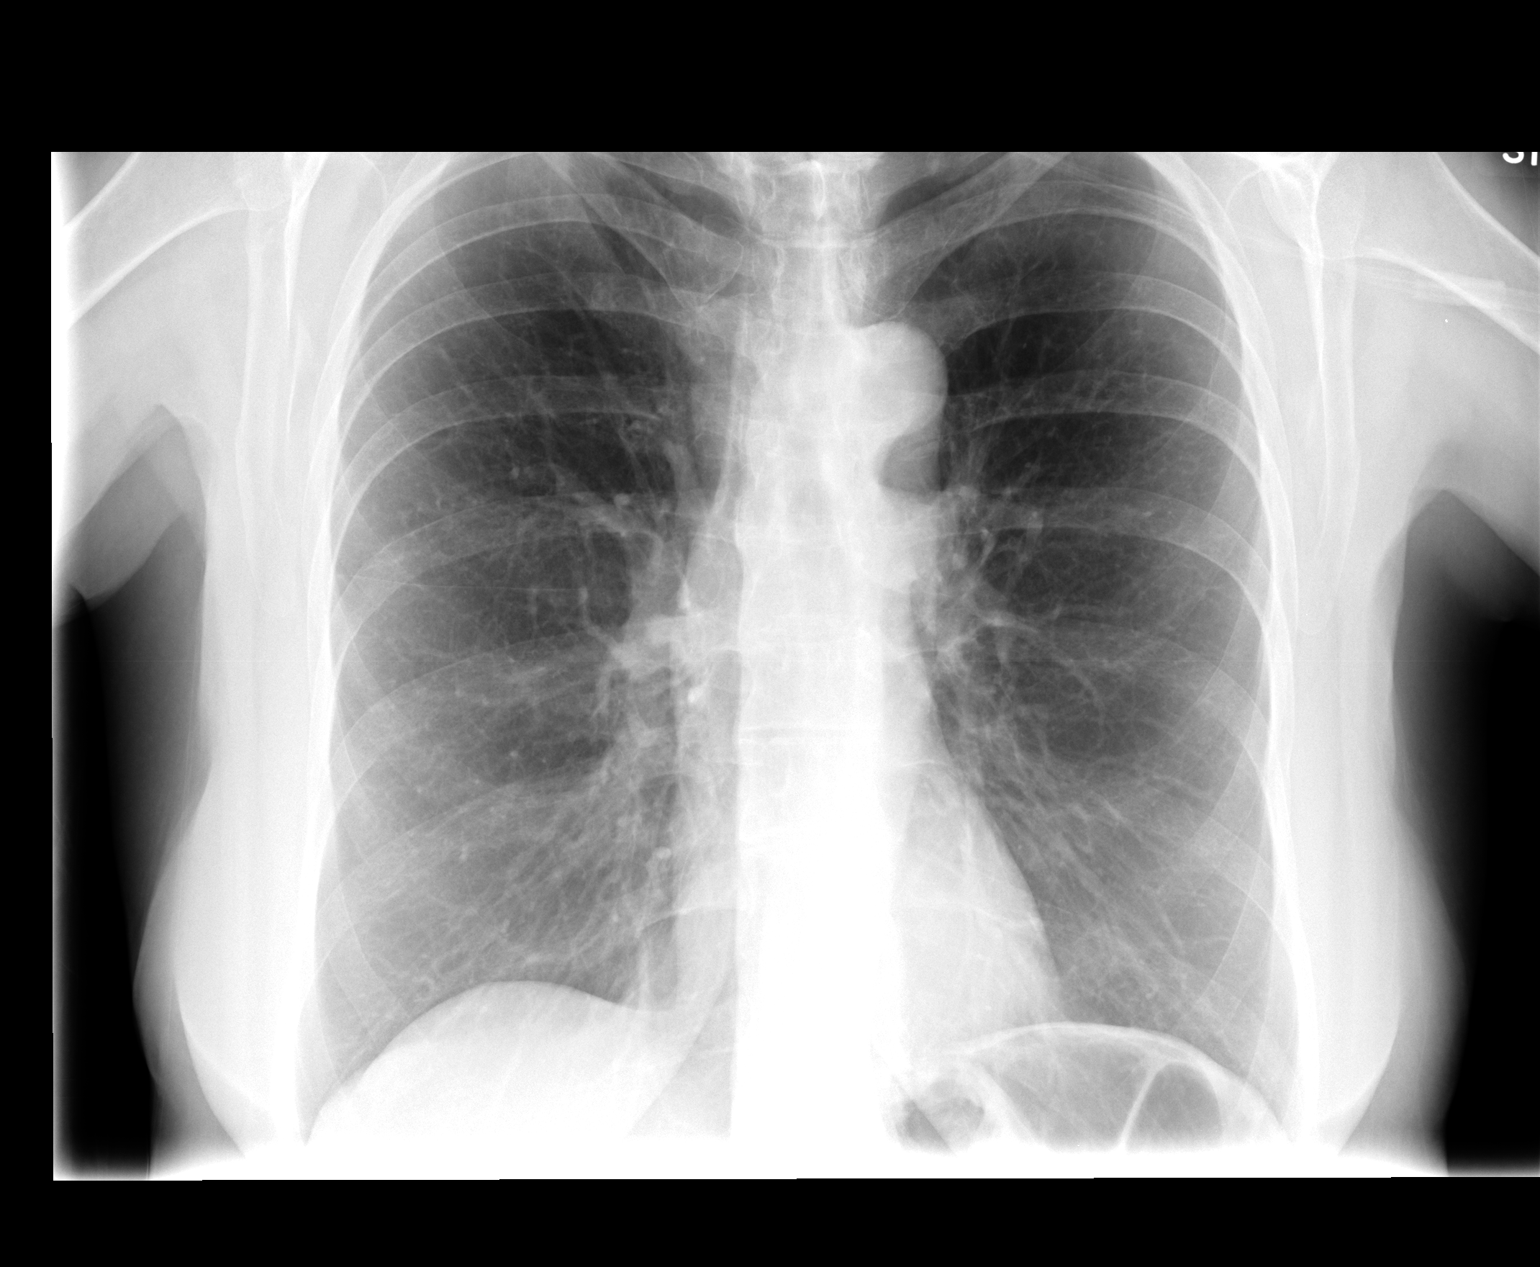

[view not recorded (2 of 2)]
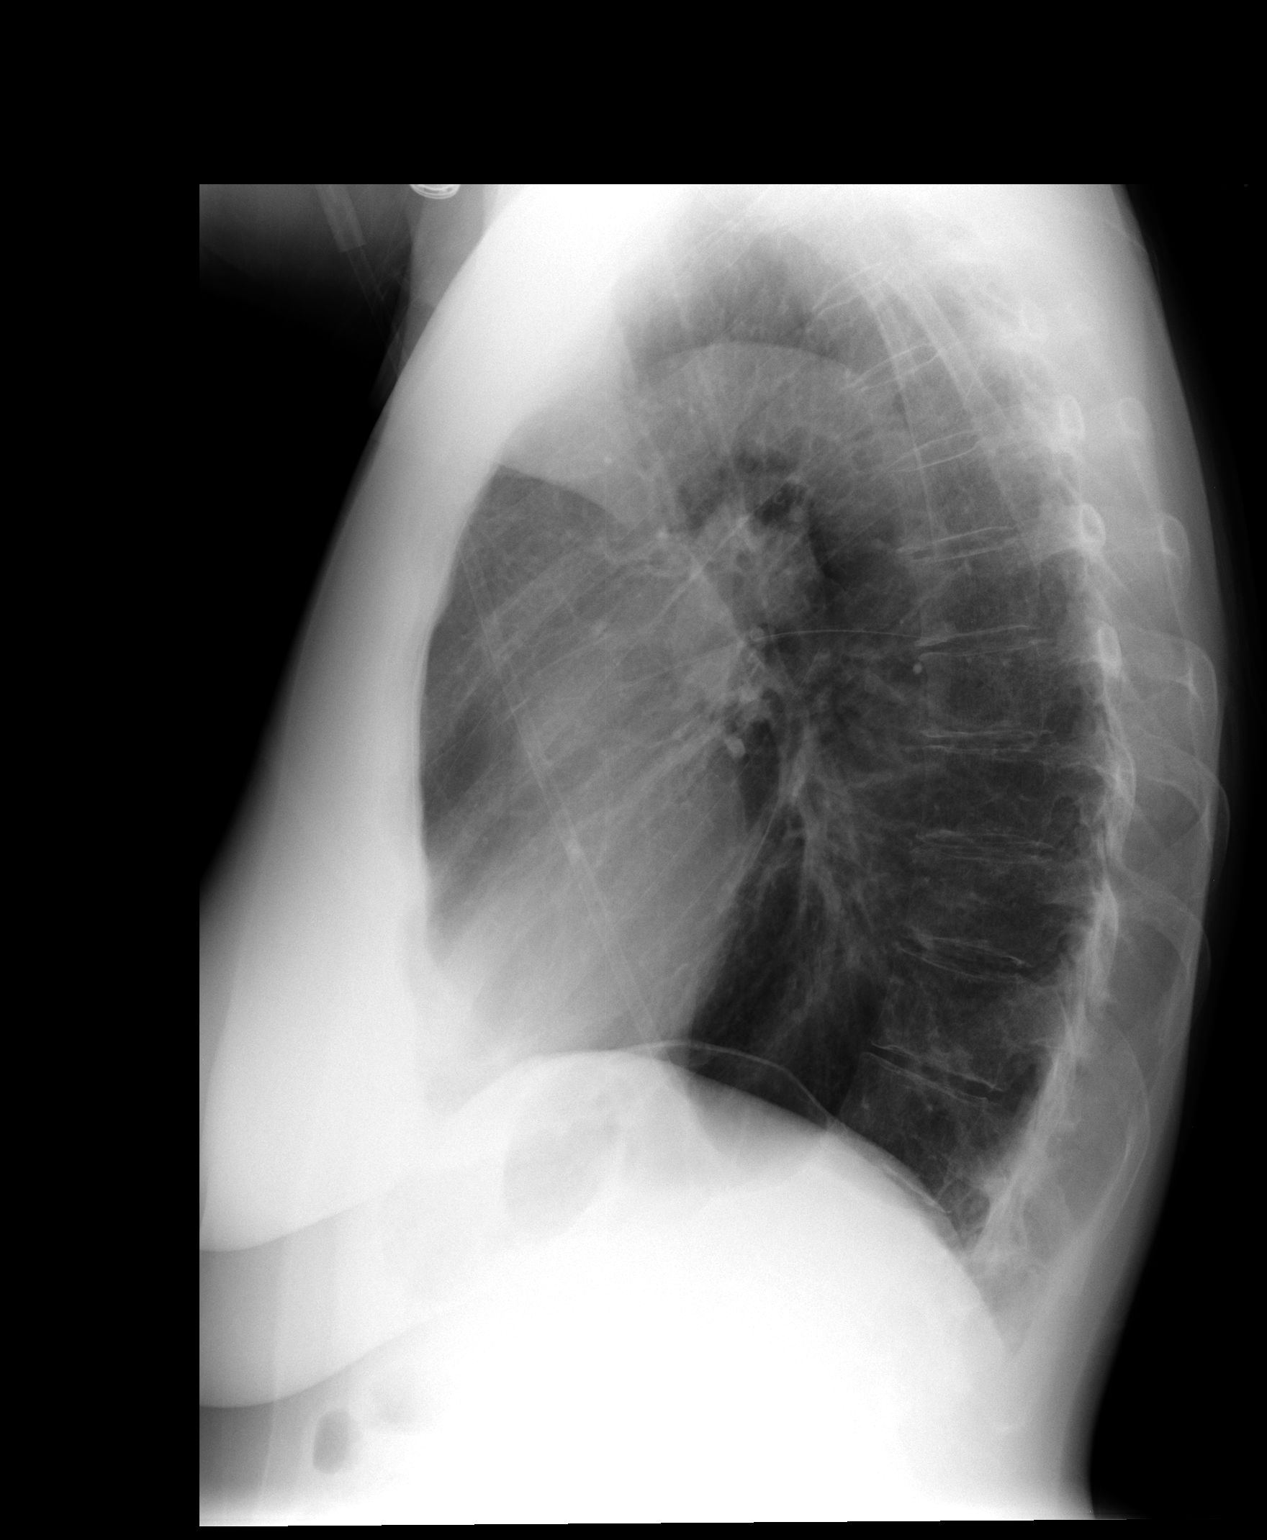

[2 of 2 positions shown; findings below may reference images not displayed]

FINDINGS: The lungs are mildly hyperinflated and are clear. There is no focal
infiltrate. The cardiopericardial silhouette is normal in size. The
pulmonary vascularity is not engorged. The mediastinum is normal in
width. There is no pleural effusion.
IMPRESSION: There is mild hyperinflation consistent with COPD or reactive airway
disease. There is no evidence of pneumonia nor CHF or other acute
cardiopulmonary abnormality.

## 2015-01-15 DIAGNOSIS — Z6822 Body mass index (BMI) 22.0-22.9, adult: Secondary | ICD-10-CM | POA: Diagnosis not present

## 2015-01-15 DIAGNOSIS — Z1389 Encounter for screening for other disorder: Secondary | ICD-10-CM | POA: Diagnosis not present

## 2015-01-15 DIAGNOSIS — Z0000 Encounter for general adult medical examination without abnormal findings: Secondary | ICD-10-CM | POA: Diagnosis not present

## 2015-01-15 DIAGNOSIS — E063 Autoimmune thyroiditis: Secondary | ICD-10-CM | POA: Diagnosis not present

## 2015-01-15 DIAGNOSIS — D510 Vitamin B12 deficiency anemia due to intrinsic factor deficiency: Secondary | ICD-10-CM | POA: Diagnosis not present

## 2015-01-15 DIAGNOSIS — E559 Vitamin D deficiency, unspecified: Secondary | ICD-10-CM | POA: Diagnosis not present

## 2015-01-21 DIAGNOSIS — T380X5A Adverse effect of glucocorticoids and synthetic analogues, initial encounter: Secondary | ICD-10-CM | POA: Diagnosis present

## 2015-01-21 DIAGNOSIS — R109 Unspecified abdominal pain: Secondary | ICD-10-CM | POA: Diagnosis not present

## 2015-01-21 DIAGNOSIS — R7989 Other specified abnormal findings of blood chemistry: Secondary | ICD-10-CM | POA: Diagnosis not present

## 2015-01-21 DIAGNOSIS — D72829 Elevated white blood cell count, unspecified: Secondary | ICD-10-CM | POA: Diagnosis not present

## 2015-01-21 DIAGNOSIS — Z79899 Other long term (current) drug therapy: Secondary | ICD-10-CM | POA: Diagnosis not present

## 2015-01-21 DIAGNOSIS — J45901 Unspecified asthma with (acute) exacerbation: Secondary | ICD-10-CM | POA: Diagnosis not present

## 2015-01-21 DIAGNOSIS — J209 Acute bronchitis, unspecified: Secondary | ICD-10-CM | POA: Diagnosis present

## 2015-01-21 DIAGNOSIS — R739 Hyperglycemia, unspecified: Secondary | ICD-10-CM | POA: Diagnosis present

## 2015-01-21 DIAGNOSIS — I509 Heart failure, unspecified: Secondary | ICD-10-CM | POA: Diagnosis not present

## 2015-01-21 DIAGNOSIS — J9602 Acute respiratory failure with hypercapnia: Secondary | ICD-10-CM | POA: Diagnosis not present

## 2015-01-21 DIAGNOSIS — F419 Anxiety disorder, unspecified: Secondary | ICD-10-CM | POA: Diagnosis not present

## 2015-01-21 DIAGNOSIS — J441 Chronic obstructive pulmonary disease with (acute) exacerbation: Secondary | ICD-10-CM | POA: Diagnosis not present

## 2015-01-21 DIAGNOSIS — E039 Hypothyroidism, unspecified: Secondary | ICD-10-CM | POA: Diagnosis not present

## 2015-01-21 DIAGNOSIS — J80 Acute respiratory distress syndrome: Secondary | ICD-10-CM | POA: Diagnosis not present

## 2015-01-21 DIAGNOSIS — R0602 Shortness of breath: Secondary | ICD-10-CM | POA: Diagnosis not present

## 2015-01-21 DIAGNOSIS — E872 Acidosis: Secondary | ICD-10-CM | POA: Diagnosis not present

## 2015-01-21 DIAGNOSIS — J9601 Acute respiratory failure with hypoxia: Secondary | ICD-10-CM | POA: Diagnosis not present

## 2015-01-21 DIAGNOSIS — H9202 Otalgia, left ear: Secondary | ICD-10-CM | POA: Diagnosis present

## 2015-01-28 ENCOUNTER — Other Ambulatory Visit (HOSPITAL_COMMUNITY): Payer: Self-pay | Admitting: Internal Medicine

## 2015-01-28 DIAGNOSIS — E063 Autoimmune thyroiditis: Secondary | ICD-10-CM | POA: Diagnosis not present

## 2015-01-28 DIAGNOSIS — Z1231 Encounter for screening mammogram for malignant neoplasm of breast: Secondary | ICD-10-CM

## 2015-01-28 DIAGNOSIS — J209 Acute bronchitis, unspecified: Secondary | ICD-10-CM | POA: Diagnosis not present

## 2015-01-28 DIAGNOSIS — R0600 Dyspnea, unspecified: Secondary | ICD-10-CM | POA: Diagnosis not present

## 2015-01-28 DIAGNOSIS — Z1389 Encounter for screening for other disorder: Secondary | ICD-10-CM | POA: Diagnosis not present

## 2015-01-28 DIAGNOSIS — Z6822 Body mass index (BMI) 22.0-22.9, adult: Secondary | ICD-10-CM | POA: Diagnosis not present

## 2015-02-02 ENCOUNTER — Ambulatory Visit (HOSPITAL_COMMUNITY): Payer: Self-pay

## 2015-04-13 DIAGNOSIS — Z23 Encounter for immunization: Secondary | ICD-10-CM | POA: Diagnosis not present

## 2015-06-16 DIAGNOSIS — Z01419 Encounter for gynecological examination (general) (routine) without abnormal findings: Secondary | ICD-10-CM | POA: Diagnosis not present

## 2015-06-16 DIAGNOSIS — Z6824 Body mass index (BMI) 24.0-24.9, adult: Secondary | ICD-10-CM | POA: Diagnosis not present

## 2015-08-20 DIAGNOSIS — D225 Melanocytic nevi of trunk: Secondary | ICD-10-CM | POA: Diagnosis not present

## 2015-08-20 DIAGNOSIS — L570 Actinic keratosis: Secondary | ICD-10-CM | POA: Diagnosis not present

## 2015-08-20 DIAGNOSIS — D485 Neoplasm of uncertain behavior of skin: Secondary | ICD-10-CM | POA: Diagnosis not present

## 2015-08-20 DIAGNOSIS — L821 Other seborrheic keratosis: Secondary | ICD-10-CM | POA: Diagnosis not present

## 2015-09-22 DIAGNOSIS — L570 Actinic keratosis: Secondary | ICD-10-CM | POA: Diagnosis not present

## 2015-09-22 DIAGNOSIS — L565 Disseminated superficial actinic porokeratosis (DSAP): Secondary | ICD-10-CM | POA: Diagnosis not present

## 2015-09-22 DIAGNOSIS — L821 Other seborrheic keratosis: Secondary | ICD-10-CM | POA: Diagnosis not present

## 2015-09-22 DIAGNOSIS — D2272 Melanocytic nevi of left lower limb, including hip: Secondary | ICD-10-CM | POA: Diagnosis not present

## 2015-11-12 DIAGNOSIS — D510 Vitamin B12 deficiency anemia due to intrinsic factor deficiency: Secondary | ICD-10-CM | POA: Diagnosis not present

## 2015-11-12 DIAGNOSIS — E538 Deficiency of other specified B group vitamins: Secondary | ICD-10-CM | POA: Diagnosis not present

## 2015-11-12 DIAGNOSIS — E559 Vitamin D deficiency, unspecified: Secondary | ICD-10-CM | POA: Diagnosis not present

## 2015-11-12 DIAGNOSIS — Z6823 Body mass index (BMI) 23.0-23.9, adult: Secondary | ICD-10-CM | POA: Diagnosis not present

## 2015-11-12 DIAGNOSIS — E782 Mixed hyperlipidemia: Secondary | ICD-10-CM | POA: Diagnosis not present

## 2015-11-12 DIAGNOSIS — E063 Autoimmune thyroiditis: Secondary | ICD-10-CM | POA: Diagnosis not present

## 2015-12-22 DIAGNOSIS — D510 Vitamin B12 deficiency anemia due to intrinsic factor deficiency: Secondary | ICD-10-CM | POA: Diagnosis not present

## 2015-12-22 DIAGNOSIS — E039 Hypothyroidism, unspecified: Secondary | ICD-10-CM | POA: Diagnosis not present

## 2015-12-22 DIAGNOSIS — R5383 Other fatigue: Secondary | ICD-10-CM | POA: Diagnosis not present

## 2015-12-22 DIAGNOSIS — E781 Pure hyperglyceridemia: Secondary | ICD-10-CM | POA: Diagnosis not present

## 2015-12-22 DIAGNOSIS — E063 Autoimmune thyroiditis: Secondary | ICD-10-CM | POA: Diagnosis not present

## 2015-12-22 DIAGNOSIS — E748 Other specified disorders of carbohydrate metabolism: Secondary | ICD-10-CM | POA: Diagnosis not present

## 2015-12-22 DIAGNOSIS — E559 Vitamin D deficiency, unspecified: Secondary | ICD-10-CM | POA: Diagnosis not present

## 2015-12-22 DIAGNOSIS — E782 Mixed hyperlipidemia: Secondary | ICD-10-CM | POA: Diagnosis not present

## 2016-03-01 DIAGNOSIS — E039 Hypothyroidism, unspecified: Secondary | ICD-10-CM | POA: Diagnosis not present

## 2016-04-10 DIAGNOSIS — Z23 Encounter for immunization: Secondary | ICD-10-CM | POA: Diagnosis not present

## 2016-07-25 DIAGNOSIS — K5730 Diverticulosis of large intestine without perforation or abscess without bleeding: Secondary | ICD-10-CM | POA: Diagnosis not present

## 2016-07-25 DIAGNOSIS — Z1211 Encounter for screening for malignant neoplasm of colon: Secondary | ICD-10-CM | POA: Diagnosis not present

## 2016-08-24 DIAGNOSIS — Z124 Encounter for screening for malignant neoplasm of cervix: Secondary | ICD-10-CM | POA: Diagnosis not present

## 2016-08-24 DIAGNOSIS — Z6824 Body mass index (BMI) 24.0-24.9, adult: Secondary | ICD-10-CM | POA: Diagnosis not present

## 2016-09-01 DIAGNOSIS — L723 Sebaceous cyst: Secondary | ICD-10-CM | POA: Diagnosis not present

## 2017-01-03 DIAGNOSIS — E039 Hypothyroidism, unspecified: Secondary | ICD-10-CM | POA: Diagnosis not present

## 2017-01-03 DIAGNOSIS — E559 Vitamin D deficiency, unspecified: Secondary | ICD-10-CM | POA: Diagnosis not present

## 2017-01-03 DIAGNOSIS — Z1389 Encounter for screening for other disorder: Secondary | ICD-10-CM | POA: Diagnosis not present

## 2017-01-03 DIAGNOSIS — E782 Mixed hyperlipidemia: Secondary | ICD-10-CM | POA: Diagnosis not present

## 2017-01-03 DIAGNOSIS — E063 Autoimmune thyroiditis: Secondary | ICD-10-CM | POA: Diagnosis not present

## 2017-01-03 DIAGNOSIS — Z681 Body mass index (BMI) 19 or less, adult: Secondary | ICD-10-CM | POA: Diagnosis not present

## 2017-01-03 DIAGNOSIS — E538 Deficiency of other specified B group vitamins: Secondary | ICD-10-CM | POA: Diagnosis not present

## 2017-02-01 DIAGNOSIS — Z1389 Encounter for screening for other disorder: Secondary | ICD-10-CM | POA: Diagnosis not present

## 2017-02-01 DIAGNOSIS — Z0000 Encounter for general adult medical examination without abnormal findings: Secondary | ICD-10-CM | POA: Diagnosis not present

## 2017-02-01 DIAGNOSIS — Z6822 Body mass index (BMI) 22.0-22.9, adult: Secondary | ICD-10-CM | POA: Diagnosis not present

## 2017-03-23 DIAGNOSIS — Z87891 Personal history of nicotine dependence: Secondary | ICD-10-CM | POA: Diagnosis not present

## 2017-03-23 DIAGNOSIS — J45901 Unspecified asthma with (acute) exacerbation: Secondary | ICD-10-CM | POA: Diagnosis not present

## 2017-03-23 DIAGNOSIS — E039 Hypothyroidism, unspecified: Secondary | ICD-10-CM | POA: Diagnosis not present

## 2017-03-23 DIAGNOSIS — J209 Acute bronchitis, unspecified: Secondary | ICD-10-CM | POA: Diagnosis not present

## 2017-03-23 DIAGNOSIS — Z7951 Long term (current) use of inhaled steroids: Secondary | ICD-10-CM | POA: Diagnosis not present

## 2017-03-23 DIAGNOSIS — Z825 Family history of asthma and other chronic lower respiratory diseases: Secondary | ICD-10-CM | POA: Diagnosis not present

## 2017-03-23 DIAGNOSIS — R0603 Acute respiratory distress: Secondary | ICD-10-CM | POA: Diagnosis not present

## 2017-03-23 DIAGNOSIS — J9690 Respiratory failure, unspecified, unspecified whether with hypoxia or hypercapnia: Secondary | ICD-10-CM | POA: Diagnosis not present

## 2017-03-23 DIAGNOSIS — J441 Chronic obstructive pulmonary disease with (acute) exacerbation: Secondary | ICD-10-CM | POA: Diagnosis not present

## 2017-03-23 DIAGNOSIS — Z9114 Patient's other noncompliance with medication regimen: Secondary | ICD-10-CM | POA: Diagnosis not present

## 2017-03-23 DIAGNOSIS — F419 Anxiety disorder, unspecified: Secondary | ICD-10-CM | POA: Diagnosis not present

## 2017-03-24 DIAGNOSIS — J4531 Mild persistent asthma with (acute) exacerbation: Secondary | ICD-10-CM | POA: Diagnosis present

## 2017-03-24 DIAGNOSIS — J9600 Acute respiratory failure, unspecified whether with hypoxia or hypercapnia: Secondary | ICD-10-CM | POA: Diagnosis present

## 2017-03-24 DIAGNOSIS — F419 Anxiety disorder, unspecified: Secondary | ICD-10-CM | POA: Diagnosis present

## 2017-03-24 DIAGNOSIS — E039 Hypothyroidism, unspecified: Secondary | ICD-10-CM | POA: Diagnosis not present

## 2017-03-24 DIAGNOSIS — J209 Acute bronchitis, unspecified: Secondary | ICD-10-CM | POA: Diagnosis not present

## 2017-03-24 DIAGNOSIS — Z9114 Patient's other noncompliance with medication regimen: Secondary | ICD-10-CM | POA: Diagnosis not present

## 2017-03-24 DIAGNOSIS — Z825 Family history of asthma and other chronic lower respiratory diseases: Secondary | ICD-10-CM | POA: Diagnosis not present

## 2017-03-24 DIAGNOSIS — R0603 Acute respiratory distress: Secondary | ICD-10-CM | POA: Diagnosis not present

## 2017-03-24 DIAGNOSIS — J45901 Unspecified asthma with (acute) exacerbation: Secondary | ICD-10-CM | POA: Diagnosis not present

## 2017-03-24 DIAGNOSIS — F413 Other mixed anxiety disorders: Secondary | ICD-10-CM | POA: Diagnosis not present

## 2017-03-24 DIAGNOSIS — Z7951 Long term (current) use of inhaled steroids: Secondary | ICD-10-CM | POA: Diagnosis not present

## 2017-03-24 DIAGNOSIS — J441 Chronic obstructive pulmonary disease with (acute) exacerbation: Secondary | ICD-10-CM | POA: Diagnosis not present

## 2017-03-24 DIAGNOSIS — Z87891 Personal history of nicotine dependence: Secondary | ICD-10-CM | POA: Diagnosis not present

## 2017-03-24 DIAGNOSIS — J9690 Respiratory failure, unspecified, unspecified whether with hypoxia or hypercapnia: Secondary | ICD-10-CM | POA: Diagnosis not present

## 2017-04-06 DIAGNOSIS — Z6822 Body mass index (BMI) 22.0-22.9, adult: Secondary | ICD-10-CM | POA: Diagnosis not present

## 2017-04-06 DIAGNOSIS — J45901 Unspecified asthma with (acute) exacerbation: Secondary | ICD-10-CM | POA: Diagnosis not present

## 2017-04-06 DIAGNOSIS — J4520 Mild intermittent asthma, uncomplicated: Secondary | ICD-10-CM | POA: Diagnosis not present

## 2017-05-12 ENCOUNTER — Ambulatory Visit (INDEPENDENT_AMBULATORY_CARE_PROVIDER_SITE_OTHER): Payer: Medicare Other | Admitting: Emergency Medicine

## 2017-05-12 ENCOUNTER — Encounter: Payer: Self-pay | Admitting: Emergency Medicine

## 2017-05-12 DIAGNOSIS — J45909 Unspecified asthma, uncomplicated: Secondary | ICD-10-CM | POA: Diagnosis not present

## 2017-05-12 DIAGNOSIS — J309 Allergic rhinitis, unspecified: Secondary | ICD-10-CM | POA: Insufficient documentation

## 2017-05-12 MED ORDER — ALBUTEROL SULFATE HFA 108 (90 BASE) MCG/ACT IN AERS: 2.0000 | INHALATION_SPRAY | RESPIRATORY_TRACT | 5 refills | Status: DC | PRN

## 2017-05-12 NOTE — Assessment & Plan Note (Signed)
Not currently on therapy 

## 2017-05-12 NOTE — Patient Instructions (Addendum)
We will perform full pulmonary function testing.  Try stopping your Pulmicort for now.  Keep your albuterol available to use 2 puffs if needed for shortness of breath or wheezing.  We may decide to start every day allergy medication at some point in the future.  Follow with Dr Lamonte Sakai next available with full PFT

## 2017-05-12 NOTE — Assessment & Plan Note (Signed)
Her clinical history does sound consistent with mild intermittent asthma with rare but true flares.  I believe she needs pulmonary function testing to quantify obstructive lung disease.  We can probably stop her Pulmicort now given the low frequency of flaring.  Keep her albuterol available.  She may need allergy treatment as we go forward given this as a potential trigger.

## 2017-05-12 NOTE — Progress Notes (Signed)
Subjective:    Patient ID: Ann Fowler, female    DOB: 04-Dec-1945, 71 y.o.   MRN: [id number]  HPI 71 year old woman with a history of allergic rhinitis, hypothyroidism, anxiety.  She was diagnosed with allergies and adult onset asthma about 7-10 yrs ago based on episodic dyspnea, wheeze, a couple of significant flares. She has had rhinitis, never on immunotherapy.    She had an apparent flare on a trip to [facility] in September '18, characterized by chest tightness, wheeze, tachypnea. No relief from albuterol so she was taken to ED. She has since been on pulmicort. Has not needed any ProAir.    Review of Systems  Constitutional: Negative for fever and unexpected weight change.  HENT: Negative for congestion, dental problem, ear pain, nosebleeds, postnasal drip, rhinorrhea, sinus pressure, sneezing, sore throat and trouble swallowing.   Eyes: Negative for redness and itching.  Respiratory: Positive for cough, chest tightness, shortness of breath and wheezing.   Cardiovascular: Negative for palpitations and leg swelling.  Gastrointestinal: Negative for nausea and vomiting.  Genitourinary: Negative for dysuria.  Musculoskeletal: Negative for joint swelling.  Skin: Negative for rash.  Neurological: Negative for headaches.  Hematological: Does not bruise/bleed easily.  Psychiatric/Behavioral: Negative for dysphoric mood. The patient is not nervous/anxious.    Past Medical History:  Diagnosis Date  . Allergy to environmental factors   . Anxiety   . Thyroid disease      No family history on file.   Social History   Social History  . Marital status: Married    Spouse name: N/A  . Number of children: N/A  . Years of education: N/A   Occupational History  . Not on file.   Social History Main Topics  . Smoking status: Never Smoker  . Smokeless tobacco: Never Used  . Alcohol use Yes  . Drug use: No  . Sexual activity: Not on file   Other Topics Concern  . Not on file   Social  History Narrative  . No narrative on file     No Known Allergies   Outpatient Medications Prior to Visit  Medication Sig Dispense Refill  . albuterol (PROVENTIL HFA;VENTOLIN HFA) 108 (90 BASE) MCG/ACT inhaler Inhale 2 puffs into the lungs every 4 (four) hours as needed for wheezing or shortness of breath. 1 Inhaler 0  . ALPRAZolam (XANAX) 1 MG tablet Take 1 mg by mouth at bedtime.      . Biotin (BIOTIN 5000) 5 MG CAPS Take 2 capsules by mouth daily.      . cholecalciferol (VITAMIN D) 1000 UNITS tablet Take 5,000 Units by mouth daily.      . CYANOCOBALAMIN IJ Inject 1,000 mcg as directed every 30 (thirty) days.      . diphenhydrAMINE (BENADRYL) 25 MG tablet Take 50 mg by mouth at bedtime.      Marland Kitchen doxycycline (VIBRAMYCIN) 100 MG capsule Take 1 capsule (100 mg total) by mouth 2 (two) times daily. 14 capsule 0  . escitalopram (LEXAPRO) 10 MG tablet Take one (1) po once a day, in the morning 30 tablet 0  . levothyroxine (SYNTHROID, LEVOTHROID) 88 MCG tablet Take 88 mcg by mouth daily.      . Multiple Vitamins-Minerals (CENTRUM PO) Take 1 tablet by mouth daily.      Marland Kitchen OVER THE COUNTER MEDICATION Take 2 tablets by mouth at bedtime.      . predniSONE (DELTASONE) 50 MG tablet Take 1 tablet (50 mg total) by mouth daily. 4 tablet  0  . fluticasone (FLONASE) 50 MCG/ACT nasal spray Place 2 sprays into the nose daily. 16 g 2   No facility-administered medications prior to visit.         Objective:   Physical Exam Vitals:   05/12/17 1402 05/12/17 1403  BP:  118/82  Pulse:  76  SpO2:  98%  Weight: 143 lb (64.9 kg)   Height: 5\' 7"  (1.702 m)    Gen: Pleasant, well-nourished, in no distress,  normal affect  ENT: No lesions,  mouth clear,  oropharynx clear, no postnasal drip  Neck: No JVD, no TMG, no carotid bruits  Lungs: No use of accessory muscles, clear without rales or rhonchi  Cardiovascular: RRR, heart sounds normal, no murmur or gallops, no peripheral edema  Musculoskeletal: No  deformities, no cyanosis or clubbing  Neuro: alert, non focal  Skin: Warm, no lesions or rashes      Assessment & Plan:  Asthma Her clinical history does sound consistent with mild intermittent asthma with rare but true flares.  I believe she needs pulmonary function testing to quantify obstructive lung disease.  We can probably stop her Pulmicort now given the low frequency of flaring.  Keep her albuterol available.  She may need allergy treatment as we go forward given this as a potential trigger.  Allergic rhinitis Not currently on therapy  Baltazar Apo, MD, PhD 05/12/2017, 2:47 PM [facility] [phone] or if no answer [phone]

## 2017-05-18 DIAGNOSIS — Z23 Encounter for immunization: Secondary | ICD-10-CM | POA: Diagnosis not present

## 2017-06-26 ENCOUNTER — Ambulatory Visit: Payer: Self-pay | Admitting: Emergency Medicine

## 2017-08-11 DIAGNOSIS — J209 Acute bronchitis, unspecified: Secondary | ICD-10-CM | POA: Diagnosis not present

## 2017-09-22 ENCOUNTER — Encounter: Payer: Self-pay | Admitting: Emergency Medicine

## 2017-09-22 ENCOUNTER — Ambulatory Visit (INDEPENDENT_AMBULATORY_CARE_PROVIDER_SITE_OTHER): Payer: Medicare Other | Admitting: Emergency Medicine

## 2017-09-22 DIAGNOSIS — J4520 Mild intermittent asthma, uncomplicated: Secondary | ICD-10-CM

## 2017-09-22 DIAGNOSIS — J301 Allergic rhinitis due to pollen: Secondary | ICD-10-CM | POA: Diagnosis not present

## 2017-09-22 DIAGNOSIS — J45909 Unspecified asthma, uncomplicated: Secondary | ICD-10-CM

## 2017-09-22 LAB — PULMONARY FUNCTION TEST
DL/VA % pred: 78 %
DL/VA: 3.99 ml/min/mmHg/L
DLCO unc % pred: 72 %
DLCO unc: 20.11 ml/min/mmHg
FEF 25-75 Post: 1.03 L/sec
FEF 25-75 Pre: 0.83 L/sec
FEF2575-%Change-Post: 24 %
FEF2575-%Pred-Post: 52 %
FEF2575-%Pred-Pre: 41 %
FEV1-%Change-Post: 7 %
FEV1-%Pred-Post: 72 %
FEV1-%Pred-Pre: 67 %
FEV1-Post: 1.79 L
FEV1-Pre: 1.66 L
FEV1FVC-%Change-Post: 2 %
FEV1FVC-%Pred-Pre: 79 %
FEV6-%Change-Post: 5 %
FEV6-%Pred-Post: 91 %
FEV6-%Pred-Pre: 86 %
FEV6-Post: 2.85 L
FEV6-Pre: 2.71 L
FEV6FVC-%Change-Post: 0 %
FEV6FVC-%Pred-Post: 102 %
FEV6FVC-%Pred-Pre: 102 %
FVC-%Change-Post: 4 %
FVC-%Pred-Post: 88 %
FVC-%Pred-Pre: 84 %
FVC-Post: 2.89 L
FVC-Pre: 2.77 L
Post FEV1/FVC ratio: 62 %
Post FEV6/FVC ratio: 99 %
Pre FEV1/FVC ratio: 60 %
Pre FEV6/FVC Ratio: 99 %
RV % pred: 141 %
RV: 3.31 L
TLC % pred: 113 %
TLC: 6.19 L

## 2017-09-22 MED ORDER — ALBUTEROL SULFATE HFA 108 (90 BASE) MCG/ACT IN AERS: 2.0000 | INHALATION_SPRAY | RESPIRATORY_TRACT | 5 refills | Status: DC | PRN

## 2017-09-22 NOTE — Patient Instructions (Signed)
Your pulmonary testing is consistent with asthma with associated chronic airflow obstruction.  We will not restart Pulmicort at this time. If you begin to have frequent breathing symptoms, then we might decide to add a daily medication in future.  We will give you a prescription for albuterol (ProAir). Use 2 puffs up to every 4 hours if needed for shortness of breath, chest tightness, wheezing.  Please start fexofenadine 180 mg once a day. Take this during the allergy season.  Follow with Dr Lamonte Sakai in 12  months or sooner if you have any problems

## 2017-09-22 NOTE — Assessment & Plan Note (Signed)
Please start fexofenadine 180 mg once a day. Take this during the allergy season.

## 2017-09-22 NOTE — Progress Notes (Signed)
PFT done today. 

## 2017-09-22 NOTE — Progress Notes (Signed)
Subjective:    Patient ID: Ann Fowler, female    DOB: Aug 21, 1945, 72 y.o.   MRN: [id number]  Asthma  She complains of cough, shortness of breath and wheezing. Pertinent negatives include no ear pain, fever, headaches, postnasal drip, rhinorrhea, sneezing, sore throat or trouble swallowing. Her past medical history is significant for asthma.   72 year old woman with a history of allergic rhinitis, hypothyroidism, anxiety.  She was diagnosed with allergies and adult onset asthma about 7-10 yrs ago based on episodic dyspnea, wheeze, a couple of significant flares. She has had rhinitis, never on immunotherapy.    She had an apparent flare on a trip to [facility] in September '18, characterized by chest tightness, wheeze, tachypnea. No relief from albuterol so she was taken to ED. She has since been on pulmicort. Has not needed any ProAir.   ROV 09/22/17 --this is a follow-up visit for evaluation of suspected asthma.  She rarely flares but did have a significant flare in September 2018 as outlined above.  She returns today with pulmonary function testing that I have reviewed. Shows moderately severe obstruction without any BD response, hyperinflated volumes, slightly decreased DLCO.  We stopped her pulmicort last visit - she tolerated well.  Symptoms right now, no wheezing, no dyspnea.   Review of Systems  Constitutional: Negative for fever and unexpected weight change.  HENT: Negative for congestion, dental problem, ear pain, nosebleeds, postnasal drip, rhinorrhea, sinus pressure, sneezing, sore throat and trouble swallowing.   Eyes: Negative for redness and itching.  Respiratory: Positive for cough, chest tightness, shortness of breath and wheezing.   Cardiovascular: Negative for palpitations and leg swelling.  Gastrointestinal: Negative for nausea and vomiting.  Genitourinary: Negative for dysuria.  Musculoskeletal: Negative for joint swelling.  Skin: Negative for rash.  Neurological: Negative  for headaches.  Hematological: Does not bruise/bleed easily.  Psychiatric/Behavioral: Negative for dysphoric mood. The patient is not nervous/anxious.    Past Medical History:  Diagnosis Date  . Allergy to environmental factors   . Anxiety   . Thyroid disease      No family history on file.   Social History   Socioeconomic History  . Marital status: Married    Spouse name: Not on file  . Number of children: Not on file  . Years of education: Not on file  . Highest education level: Not on file  Social Needs  . Financial resource strain: Not on file  . Food insecurity - worry: Not on file  . Food insecurity - inability: Not on file  . Transportation needs - medical: Not on file  . Transportation needs - non-medical: Not on file  Occupational History  . Not on file  Tobacco Use  . Smoking status: Never Smoker  . Smokeless tobacco: Never Used  Substance and Sexual Activity  . Alcohol use: Yes  . Drug use: No  . Sexual activity: Not on file  Other Topics Concern  . Not on file  Social History Narrative  . Not on file     No Known Allergies   Outpatient Medications Prior to Visit  Medication Sig Dispense Refill  . albuterol (PROVENTIL HFA;VENTOLIN HFA) 108 (90 Base) MCG/ACT inhaler Inhale 2 puffs into the lungs every 4 (four) hours as needed for wheezing or shortness of breath. 1 Inhaler 5  . budesonide (PULMICORT) 180 MCG/ACT inhaler Inhale 2 puffs into the lungs 2 (two) times daily.    Marland Kitchen ipratropium-albuterol (DUONEB) 0.5-2.5 (3) MG/3ML SOLN Take 3 mLs by  nebulization every 6 (six) hours as needed.     No facility-administered medications prior to visit.         Objective:   Physical Exam Vitals:   09/22/17 1547 09/22/17 1548  BP:  132/88  Pulse:  99  SpO2:  99%  Weight: 139 lb (63 kg)   Height: 5' 6.5" (1.689 m)    Gen: Pleasant, well-nourished, in no distress,  normal affect  ENT: No lesions,  mouth clear,  oropharynx clear, no postnasal drip  Neck:  No JVD, no stridor  Lungs: No use of accessory muscles, clear without rales or rhonchi  Cardiovascular: RRR, heart sounds normal, no murmur or gallops, no peripheral edema  Musculoskeletal: No deformities, no cyanosis or clubbing  Neuro: alert, non focal  Skin: Warm, no lesions or rashes  25 minutes spent counseling her regarding the diagnosis of asthma, the different roles for maintenance medications versus rescue medications.     Assessment & Plan:  Asthma Your pulmonary testing is consistent with asthma with associated chronic airflow obstruction.  We will not restart Pulmicort at this time. If you begin to have frequent breathing symptoms, then we might decide to add a daily medication in future.  We will give you a prescription for albuterol (ProAir). Use 2 puffs up to every 4 hours if needed for shortness of breath, chest tightness, wheezing.  Follow with Dr Lamonte Sakai in 12  months or sooner if you have any problems  Allergic rhinitis Please start fexofenadine 180 mg once a day. Take this during the allergy season.   Baltazar Apo, MD, PhD 09/22/2017, 4:25 PM [facility] [phone] or if no answer [phone]

## 2017-09-22 NOTE — Assessment & Plan Note (Signed)
Your pulmonary testing is consistent with asthma with associated chronic airflow obstruction.  We will not restart Pulmicort at this time. If you begin to have frequent breathing symptoms, then we might decide to add a daily medication in future.  We will give you a prescription for albuterol (ProAir). Use 2 puffs up to every 4 hours if needed for shortness of breath, chest tightness, wheezing.  Follow with Dr Lamonte Sakai in 12  months or sooner if you have any problems

## 2017-10-18 DIAGNOSIS — Z01419 Encounter for gynecological examination (general) (routine) without abnormal findings: Secondary | ICD-10-CM | POA: Diagnosis not present

## 2017-10-18 DIAGNOSIS — Z6822 Body mass index (BMI) 22.0-22.9, adult: Secondary | ICD-10-CM | POA: Diagnosis not present

## 2018-02-15 DIAGNOSIS — F419 Anxiety disorder, unspecified: Secondary | ICD-10-CM | POA: Diagnosis not present

## 2018-02-15 DIAGNOSIS — E063 Autoimmune thyroiditis: Secondary | ICD-10-CM | POA: Diagnosis not present

## 2018-02-15 DIAGNOSIS — E559 Vitamin D deficiency, unspecified: Secondary | ICD-10-CM | POA: Diagnosis not present

## 2018-02-15 DIAGNOSIS — Z6821 Body mass index (BMI) 21.0-21.9, adult: Secondary | ICD-10-CM | POA: Diagnosis not present

## 2018-02-15 DIAGNOSIS — R946 Abnormal results of thyroid function studies: Secondary | ICD-10-CM | POA: Diagnosis not present

## 2018-02-15 DIAGNOSIS — Z0000 Encounter for general adult medical examination without abnormal findings: Secondary | ICD-10-CM | POA: Diagnosis not present

## 2018-02-15 DIAGNOSIS — E785 Hyperlipidemia, unspecified: Secondary | ICD-10-CM | POA: Diagnosis not present

## 2018-02-15 DIAGNOSIS — E538 Deficiency of other specified B group vitamins: Secondary | ICD-10-CM | POA: Diagnosis not present

## 2018-02-15 DIAGNOSIS — Z1389 Encounter for screening for other disorder: Secondary | ICD-10-CM | POA: Diagnosis not present

## 2018-03-21 ENCOUNTER — Other Ambulatory Visit: Payer: Self-pay

## 2018-03-21 ENCOUNTER — Encounter (HOSPITAL_COMMUNITY): Payer: Self-pay | Admitting: Family Medicine

## 2018-03-21 ENCOUNTER — Ambulatory Visit (INDEPENDENT_AMBULATORY_CARE_PROVIDER_SITE_OTHER)
Admission: EM | Admit: 2018-03-21 | Discharge: 2018-03-21 | Disposition: A | Discharge status: Home / Self Care | Payer: Medicare Other | Source: Home / Self Care | Attending: Family Medicine | Admitting: Family Medicine

## 2018-03-21 ENCOUNTER — Encounter (HOSPITAL_COMMUNITY): Payer: Self-pay | Admitting: Internal Medicine

## 2018-03-21 ENCOUNTER — Ambulatory Visit (INDEPENDENT_AMBULATORY_CARE_PROVIDER_SITE_OTHER): Payer: Medicare Other

## 2018-03-21 ENCOUNTER — Observation Stay (HOSPITAL_COMMUNITY)
Admission: EM | Admit: 2018-03-21 | Discharge: 2018-03-22 | Disposition: A | Discharge status: Home / Self Care | Payer: Medicare Other | Attending: Internal Medicine | Admitting: Internal Medicine

## 2018-03-21 DIAGNOSIS — I700 Atherosclerosis of aorta: Secondary | ICD-10-CM | POA: Insufficient documentation

## 2018-03-21 DIAGNOSIS — R0902 Hypoxemia: Secondary | ICD-10-CM | POA: Insufficient documentation

## 2018-03-21 DIAGNOSIS — Z79899 Other long term (current) drug therapy: Secondary | ICD-10-CM | POA: Diagnosis not present

## 2018-03-21 DIAGNOSIS — Z809 Family history of malignant neoplasm, unspecified: Secondary | ICD-10-CM | POA: Insufficient documentation

## 2018-03-21 DIAGNOSIS — E039 Hypothyroidism, unspecified: Secondary | ICD-10-CM | POA: Insufficient documentation

## 2018-03-21 DIAGNOSIS — Z7951 Long term (current) use of inhaled steroids: Secondary | ICD-10-CM | POA: Diagnosis not present

## 2018-03-21 DIAGNOSIS — Z800 Family history of malignant neoplasm of digestive organs: Secondary | ICD-10-CM | POA: Insufficient documentation

## 2018-03-21 DIAGNOSIS — R0682 Tachypnea, not elsewhere classified: Secondary | ICD-10-CM | POA: Insufficient documentation

## 2018-03-21 DIAGNOSIS — J45901 Unspecified asthma with (acute) exacerbation: Principal | ICD-10-CM | POA: Diagnosis present

## 2018-03-21 DIAGNOSIS — F419 Anxiety disorder, unspecified: Secondary | ICD-10-CM | POA: Diagnosis not present

## 2018-03-21 DIAGNOSIS — R0602 Shortness of breath: Secondary | ICD-10-CM | POA: Diagnosis not present

## 2018-03-21 DIAGNOSIS — J441 Chronic obstructive pulmonary disease with (acute) exacerbation: Secondary | ICD-10-CM | POA: Diagnosis not present

## 2018-03-21 DIAGNOSIS — R05 Cough: Secondary | ICD-10-CM | POA: Diagnosis not present

## 2018-03-21 DIAGNOSIS — R062 Wheezing: Secondary | ICD-10-CM | POA: Diagnosis not present

## 2018-03-21 HISTORY — DX: Chronic obstructive pulmonary disease, unspecified: J44.9

## 2018-03-21 LAB — CBC WITH DIFFERENTIAL/PLATELET
Abs Immature Granulocytes: 0 10*3/uL (ref 0.0–0.1)
Basophils Absolute: 0 10*3/uL (ref 0.0–0.1)
Basophils Relative: 0 %
Eosinophils Absolute: 0.3 10*3/uL (ref 0.0–0.7)
Eosinophils Relative: 3 %
HCT: 45.8 % (ref 36.0–46.0)
Hemoglobin: 14.8 g/dL (ref 12.0–15.0)
Immature Granulocytes: 0 %
Lymphocytes Relative: 17 %
Lymphs Abs: 1.5 10*3/uL (ref 0.7–4.0)
MCH: 31.5 pg (ref 26.0–34.0)
MCHC: 32.3 g/dL (ref 30.0–36.0)
MCV: 97.4 fL (ref 78.0–100.0)
Monocytes Absolute: 0.7 10*3/uL (ref 0.1–1.0)
Monocytes Relative: 7 %
Neutro Abs: 6.6 10*3/uL (ref 1.7–7.7)
Neutrophils Relative %: 73 %
Platelets: 225 10*3/uL (ref 150–400)
RBC: 4.7 MIL/uL (ref 3.87–5.11)
RDW: 13.7 % (ref 11.5–15.5)
WBC: 9.1 10*3/uL (ref 4.0–10.5)

## 2018-03-21 LAB — BASIC METABOLIC PANEL
Anion gap: 12 (ref 5–15)
BUN: 7 mg/dL — ABNORMAL LOW (ref 8–23)
CO2: 25 mmol/L (ref 22–32)
Calcium: 9.6 mg/dL (ref 8.9–10.3)
Chloride: 102 mmol/L (ref 98–111)
Creatinine, Ser: 0.8 mg/dL (ref 0.44–1.00)
GFR calc Af Amer: >60 mL/min (ref >60)
GFR calc non Af Amer: >60 mL/min (ref >60)
Glucose, Bld: 139 mg/dL — ABNORMAL HIGH (ref 70–99)
Potassium: 3.8 mmol/L (ref 3.5–5.1)
Sodium: 139 mmol/L (ref 135–145)

## 2018-03-21 MED ORDER — IPRATROPIUM-ALBUTEROL 0.5-2.5 (3) MG/3ML IN SOLN
RESPIRATORY_TRACT | Status: AC
  Filled 2018-03-21: qty 3

## 2018-03-21 MED ORDER — SODIUM CHLORIDE 0.9 % IV SOLN: 250.0000 mL | INTRAVENOUS | Status: DC | PRN

## 2018-03-21 MED ORDER — SODIUM CHLORIDE 0.9% FLUSH: 3.0000 mL | INTRAVENOUS | Status: DC | PRN

## 2018-03-21 MED ORDER — IPRATROPIUM-ALBUTEROL 0.5-2.5 (3) MG/3ML IN SOLN
3.0000 mL | Freq: Three times a day (TID) | RESPIRATORY_TRACT | Status: DC
  Filled 2018-03-21: qty 3

## 2018-03-21 MED ORDER — METHYLPREDNISOLONE SODIUM SUCC 125 MG IJ SOLR
80.0000 mg | Freq: Three times a day (TID) | INTRAMUSCULAR | Status: DC
  Administered 2018-03-22 (×2): 80 mg via INTRAVENOUS
  Filled 2018-03-21 (×2): qty 2

## 2018-03-21 MED ORDER — IPRATROPIUM BROMIDE 0.02 % IN SOLN
0.5000 mg | Freq: Once | RESPIRATORY_TRACT | Status: AC
  Administered 2018-03-21: 0.5 mg via RESPIRATORY_TRACT
  Filled 2018-03-21: qty 2.5

## 2018-03-21 MED ORDER — IPRATROPIUM-ALBUTEROL 0.5-2.5 (3) MG/3ML IN SOLN
3.0000 mL | Freq: Once | RESPIRATORY_TRACT | Status: AC
  Administered 2018-03-21: 3 mL via RESPIRATORY_TRACT

## 2018-03-21 MED ORDER — ENOXAPARIN SODIUM 40 MG/0.4ML ~~LOC~~ SOLN: 40.0000 mg | SUBCUTANEOUS | Status: DC

## 2018-03-21 MED ORDER — METHYLPREDNISOLONE SODIUM SUCC 125 MG IJ SOLR
125.0000 mg | Freq: Once | INTRAMUSCULAR | Status: AC
  Administered 2018-03-21: 125 mg via INTRAVENOUS
  Filled 2018-03-21: qty 2

## 2018-03-21 MED ORDER — ACETAMINOPHEN 325 MG PO TABS: 650.0000 mg | ORAL_TABLET | Freq: Four times a day (QID) | ORAL | Status: DC | PRN

## 2018-03-21 MED ORDER — ALBUTEROL (5 MG/ML) CONTINUOUS INHALATION SOLN
10.0000 mg/h | INHALATION_SOLUTION | RESPIRATORY_TRACT | Status: AC
  Administered 2018-03-21: 10 mg/h via RESPIRATORY_TRACT
  Filled 2018-03-21: qty 20

## 2018-03-21 MED ORDER — ALBUTEROL SULFATE (2.5 MG/3ML) 0.083% IN NEBU
5.0000 mg | INHALATION_SOLUTION | Freq: Once | RESPIRATORY_TRACT | Status: AC
  Administered 2018-03-21: 5 mg via RESPIRATORY_TRACT
  Filled 2018-03-21: qty 6

## 2018-03-21 MED ORDER — CYANOCOBALAMIN 1000 MCG/ML IJ SOLN: 1000.0000 ug | INTRAMUSCULAR | Status: DC

## 2018-03-21 MED ORDER — ACETAMINOPHEN 650 MG RE SUPP: 650.0000 mg | Freq: Four times a day (QID) | RECTAL | Status: DC | PRN

## 2018-03-21 MED ORDER — IPRATROPIUM-ALBUTEROL 0.5-2.5 (3) MG/3ML IN SOLN
3.0000 mL | Freq: Three times a day (TID) | RESPIRATORY_TRACT | Status: DC
  Administered 2018-03-22: 3 mL via RESPIRATORY_TRACT
  Filled 2018-03-21: qty 3

## 2018-03-21 MED ORDER — SODIUM CHLORIDE 0.9% FLUSH
3.0000 mL | Freq: Two times a day (BID) | INTRAVENOUS | Status: DC
  Administered 2018-03-22 (×2): 3 mL via INTRAVENOUS

## 2018-03-21 MED ORDER — ALBUTEROL SULFATE (2.5 MG/3ML) 0.083% IN NEBU: 2.5000 mg | INHALATION_SOLUTION | Freq: Four times a day (QID) | RESPIRATORY_TRACT | Status: DC | PRN

## 2018-03-21 MED ORDER — ALPRAZOLAM 0.5 MG PO TABS
0.5000 mg | ORAL_TABLET | Freq: Four times a day (QID) | ORAL | Status: DC | PRN
  Administered 2018-03-21: 0.5 mg via ORAL
  Filled 2018-03-21: qty 1

## 2018-03-21 MED ORDER — HYDROCODONE-HOMATROPINE 5-1.5 MG/5ML PO SYRP
5.0000 mL | ORAL_SOLUTION | Freq: Four times a day (QID) | ORAL | Status: DC | PRN
  Administered 2018-03-22: 5 mL via ORAL
  Filled 2018-03-21: qty 5

## 2018-03-21 MED ORDER — LEVOTHYROXINE SODIUM 75 MCG PO TABS
75.0000 ug | ORAL_TABLET | Freq: Every day | ORAL | Status: DC
  Administered 2018-03-22: 75 ug via ORAL
  Filled 2018-03-21: qty 1

## 2018-03-21 MED ORDER — UMECLIDINIUM-VILANTEROL 62.5-25 MCG/INH IN AEPB
1.0000 | INHALATION_SPRAY | Freq: Every day | RESPIRATORY_TRACT | Status: DC
  Administered 2018-03-22: 1 via RESPIRATORY_TRACT
  Filled 2018-03-21: qty 14

## 2018-03-21 NOTE — ED Notes (Signed)
Pt ambulated throughout hallway without assistance, gait steady. Pt's O2 sats began at 96% and dropped to 89% during ambulation. No shortness of breath noted, pt stated she 'feels great.'

## 2018-03-21 NOTE — H&P (Signed)
TRH H&P   Patient Demographics:    Ann Fowler, is a 72 y.o. female  MRN: [id number]   DOB - 12-13-1945  Admit Date - 03/21/2018  Outpatient Primary MD for the patient is Redmond School, MD  Referring MD/NP/PA: Irena Cords  Outpatient Specialists:      Patient coming from:   home  Chief Complaint  Patient presents with  . Cough      HPI:    Ann Fowler  is a 72 y.o. female, w Hypothyroidism, Copd, (not on home o2), apparently presents with cough for the past 3 days as well as wheezing, dyspnea. cough apparently now has green sputum.   Pt went to urgent care for evaluation and sent to ER.   In Ed,  CXR IMPRESSION: Mild hyperinflation consistent with reactive airway disease or COPD.  No pneumonia, CHF, nor other acute cardiopulmonary abnormality.  Thoracic aortic atherosclerosis.   Na 139, K 3.8, Bun 7, Creatinine 0.80 Wbc 9.1, hgb 14.8, Plt 225  Pox apparently 87% with ambulation and not improved with breathing treatments in ER.   Pt will be admitted for iv steroids and breathing treatments for Asthma/ Copd exacerbation.      Review of systems:    In addition to the HPI above,     No Fever-chills, No Headache, No changes with Vision or hearing, No problems swallowing food or Liquids, No Chest pain,  No Abdominal pain, No Nausea or Vommitting, Bowel movements are regular, No Blood in stool or Urine, No dysuria, No new skin rashes or bruises, No new joints pains-aches,  No new weakness, tingling, numbness in any extremity, No recent weight gain or loss, No polyuria, polydypsia or polyphagia, No significant Mental Stressors.  A full 10 point Review of Systems was done, except as stated above, all other Review of Systems were negative.   With Past History of the following :    Past Medical History:  Diagnosis Date  . Allergy to environmental  factors   . Anxiety   . COPD (chronic obstructive pulmonary disease) ([place])   . Thyroid disease       Past Surgical History:  Procedure Laterality Date  . NECK SURGERY        Social History:     Social History   Tobacco Use  . Smoking status: Never Smoker  . Smokeless tobacco: Never Used  Substance Use Topics  . Alcohol use: Yes     Lives - at home  Mobility - walks by self   Family History :     Family History  Problem Relation Age of Onset  . Cancer Mother   . Liver cancer Father        Home Medications:   Prior to Admission medications   Medication Sig Start Date End Date Taking? Authorizing Provider  albuterol (PROVENTIL HFA;VENTOLIN HFA) 108 (90 Base) MCG/ACT inhaler Inhale 2  puffs into the lungs every 4 (four) hours as needed for wheezing or shortness of breath. 09/22/17   Collene Gobble, MD     Allergies:    No Known Allergies   Physical Exam:   Vitals  Blood pressure 126/84, pulse 88, temperature 97.6 F (36.4 C), temperature source Oral, resp. rate 18, height 5\' 7"  (1.702 m), weight 59.9 kg, SpO2 95 %.   1. General lying in bed in NAD,    2. Normal affect and insight, Not Suicidal or Homicidal, Awake Alert, Oriented X 3.  3. No F.N deficits, ALL C.Nerves Intact, Strength 5/5 all 4 extremities, Sensation intact all 4 extremities, Plantars down going.  4. Ears and Eyes appear Normal, Conjunctivae clear, PERRLA. Moist Oral Mucosa.  5. Supple Neck, No JVD, No cervical lymphadenopathy appriciated, No Carotid Bruits.  6. Symmetrical Chest wall movement, slightly tight,  + bilateral exp wheezing. No crackles.   7. RRR, No Gallops, Rubs or Murmurs, No Parasternal Heave.  8. Positive Bowel Sounds, Abdomen Soft, No tenderness, No organomegaly appriciated,No rebound -guarding or rigidity.  9.  No Cyanosis, Normal Skin Turgor, No Skin Rash or Bruise.  10. Good muscle tone,  joints appear normal , no effusions, Normal ROM.  11. No Palpable Lymph  Nodes in Neck or Axillae     Data Review:    CBC Recent Labs  Lab 03/21/18 1653  WBC 9.1  HGB 14.8  HCT 45.8  PLT 225  MCV 97.4  MCH 31.5  MCHC 32.3  RDW 13.7  LYMPHSABS 1.5  MONOABS 0.7  EOSABS 0.3  BASOSABS 0.0   ------------------------------------------------------------------------------------------------------------------  Chemistries  Recent Labs  Lab 03/21/18 1653  NA 139  K 3.8  CL 102  CO2 25  GLUCOSE 139*  BUN 7*  CREATININE 0.80  CALCIUM 9.6   ------------------------------------------------------------------------------------------------------------------ estimated creatinine clearance is 61 mL/min (by C-G formula based on SCr of 0.8 mg/dL). ------------------------------------------------------------------------------------------------------------------ No results for input(s): TSH, T4TOTAL, T3FREE, THYROIDAB in the last 72 hours.  Invalid input(s): FREET3  Coagulation profile No results for input(s): INR, PROTIME in the last 168 hours. ------------------------------------------------------------------------------------------------------------------- No results for input(s): DDIMER in the last 72 hours. -------------------------------------------------------------------------------------------------------------------  Cardiac Enzymes No results for input(s): CKMB, TROPONINI, MYOGLOBIN in the last 168 hours.  Invalid input(s): CK ------------------------------------------------------------------------------------------------------------------ No results found for: BNP   ---------------------------------------------------------------------------------------------------------------  Urinalysis No results found for: COLORURINE, APPEARANCEUR, [place], [place], GLUCOSEU, [place], BILIRUBINUR, KETONESUR, PROTEINUR, UROBILINOGEN, NITRITE,  LEUKOCYTESUR  ----------------------------------------------------------------------------------------------------------------   Imaging Results:    Dg Chest 2 View  Result Date: 03/21/2018 CLINICAL DATA:  SOB, productive cough, nonsmoker. EXAM: CHEST - 2 VIEW COMPARISON:  Chest x-ray of June 20, 2013 FINDINGS: The lungs are mildly hyperinflated. There is no focal infiltrate. There is no pleural effusion. The heart and pulmonary vascularity are normal. The mediastinum is normal in width. The trachea is midline. There is calcification in the wall of the aortic arch. The bony thorax exhibits no acute abnormality. IMPRESSION: Mild hyperinflation consistent with reactive airway disease or COPD. No pneumonia, CHF, nor other acute cardiopulmonary abnormality. Thoracic aortic atherosclerosis. Electronically Signed   By: David  Martinique M.D.   On: 03/21/2018 13:18       Assessment & Plan:    Principal Problem:   COPD exacerbation (HCC)    Dyspnea, wheezing secondary to Copd exacerbation Solumedrol 80mg  iv q8h Zithromax 500mg  iv qday Anoro 1puff bid Albuterol neb tid and q6h prn  Please obtain home health DME for home o2 in am Pt can  probably be transitioned to prednisone in AM   Hypothyroidism Cont Levothyroxine     DVT Prophylaxis   Lovenox - SCDs  AM Labs Ordered, also please review Full Orders  Family Communication: Admission, patients condition and plan of care including tests being ordered have been discussed with the patient  who indicate understanding and agree with the plan and Code Status.  Code Status  FULL CODE  Likely DC to  home  Condition GUARDED    Consults called:   none  Admission status: observation,  Pt iv streroids and breathing treatments for Copd exacerbation.  Pt will be admitted observation and will go home with home o2.   Time spent in minutes : 55   Jani Gravel M.D on 03/21/2018 at 11:38 PM  Between 7am to 7pm - Pager - [phone]   After 7pm go  to [email or web address] - password [facility]  [facility] - Office  [phone]

## 2018-03-21 NOTE — ED Provider Notes (Signed)
Patient placed in Quick Look pathway, seen and evaluated   Chief Complaint: Cough  HPI:   72 year old with a history of asthma presenting with productive cough with green sputum, onset 2 days ago with associated shortness of breath.  She denies chest pain, chest tightness, fever, or chills.  She is a never smoker.  She has used her albuterol inhaler once over the past 2 days with no relief.  She presented to urgent care and was found to be tachypneic with SaO2 of 88% on room air.  She was placed on 2 L and SaO2 improved to 93%.  She was sent to the ED for further evaluation.  She states to get the symptoms 2-3 times a year and typically a steroid shot and hydro codon will help.  ROS: Productive cough, dyspnea, hypoxia  Physical Exam:   Gen: No distress  Neuro: Awake and Alert  Skin: Warm    Focused Exam: INpiratory wheezes on the left and bilateral expiratory wheezes in all fields.   Initiation of care has begun. The patient has been counseled on the process, plan, and necessity for staying for the completion/evaluation, and the remainder of the medical screening examination    Joanne Gavel, PA-C 03/21/18 1526    Virgel Manifold, MD 03/23/18 1039

## 2018-03-21 NOTE — ED Triage Notes (Signed)
Pt here for productive cough and green sputum; pt denies fever; pt with O2 sats 88% on RA

## 2018-03-21 NOTE — ED Notes (Signed)
Ambulated pt in the hallway, O2 sats dropped down 87-89%. O2 went back up to 96-99% when pt was sitting back down.

## 2018-03-21 NOTE — Discharge Instructions (Signed)
Given new oxygen requirement despite duoneb, please go to the emergency department for further evaluation needed.

## 2018-03-21 NOTE — ED Triage Notes (Addendum)
Patient to ED from [facility] for productive cough, low O2 saturation - 88% RA initially, given 2L O2 nasal cannula and a duoneb with some improvement. Patient currently 96% on RA. She denies fevers/chills. History of asthma, COPD. Not on home O2. Resp e/u. States she has been admitted for similar (reactive airway disease) in 2016.

## 2018-03-21 NOTE — ED Provider Notes (Signed)
72 year old female with history of asthma, thyroid disease, COPD comes in for 2-day history of productive cough, shortness of breath.  She denies fever, chills, night sweats.  Has been using albuterol today without relief.  At triage, patient was found to be tachypneic, with O2 sat of 88% at room air.  She was put on 2 L oxygen, and O2 sat at 93%.  Patient denies needing oxygen at baseline, and normal O2 sat around 99%.  She was admitted in 2016 for hypoxia secondary to reactive airway disease.  On exam, patient is speaking in full sentences, without obvious distress.  Lungs with mild wheezing, and decreased breath sounds throughout.  Chest x-ray obtained, and DuoNeb started.  Checks x-ray negative for CHF, pneumonia, and consistent with reactive airway disease.  Patient states improvement of symptoms post DuoNeb.  Lungs with improved breath sounds, but still decreased with increased wheezing.  She continues to be on 2 L oxygen with O2 sat of 92% post DuoNeb.  Given new demand of oxygen, without improvement on DuoNeb, will discharge in stable condition to the emergency department for further evaluation.   Ok Edwards, PA-C 03/21/18 1356

## 2018-03-22 DIAGNOSIS — J45901 Unspecified asthma with (acute) exacerbation: Secondary | ICD-10-CM | POA: Diagnosis not present

## 2018-03-22 LAB — COMPREHENSIVE METABOLIC PANEL
ALK PHOS: 81 U/L (ref 38–126)
ALT: 14 U/L (ref 0–44)
ANION GAP: 10 (ref 5–15)
AST: 19 U/L (ref 15–41)
Albumin: 3.8 g/dL (ref 3.5–5.0)
BUN: 6 mg/dL — ABNORMAL LOW (ref 8–23)
CALCIUM: 9.2 mg/dL (ref 8.9–10.3)
CO2: 24 mmol/L (ref 22–32)
Chloride: 105 mmol/L (ref 98–111)
Creatinine, Ser: 0.69 mg/dL (ref 0.44–1.00)
Glucose, Bld: 220 mg/dL — ABNORMAL HIGH (ref 70–99)
Potassium: 3.9 mmol/L (ref 3.5–5.1)
SODIUM: 139 mmol/L (ref 135–145)
Total Bilirubin: 0.9 mg/dL (ref 0.3–1.2)
Total Protein: 6.5 g/dL (ref 6.5–8.1)

## 2018-03-22 LAB — CBC
HCT: 43.3 % (ref 36.0–46.0)
HEMOGLOBIN: 14.1 g/dL (ref 12.0–15.0)
MCH: 31.3 pg (ref 26.0–34.0)
MCHC: 32.6 g/dL (ref 30.0–36.0)
MCV: 96 fL (ref 78.0–100.0)
PLATELETS: 209 10*3/uL (ref 150–400)
RBC: 4.51 MIL/uL (ref 3.87–5.11)
RDW: 13.9 % (ref 11.5–15.5)
WBC: 4.3 10*3/uL (ref 4.0–10.5)

## 2018-03-22 MED ORDER — ALBUTEROL SULFATE (2.5 MG/3ML) 0.083% IN NEBU: 2.5000 mg | INHALATION_SOLUTION | Freq: Four times a day (QID) | RESPIRATORY_TRACT | 12 refills | Status: DC | PRN

## 2018-03-22 MED ORDER — ALBUTEROL SULFATE (2.5 MG/3ML) 0.083% IN NEBU: 2.5000 mg | INHALATION_SOLUTION | Freq: Four times a day (QID) | RESPIRATORY_TRACT | Status: DC | PRN

## 2018-03-22 MED ORDER — ALBUTEROL SULFATE (2.5 MG/3ML) 0.083% IN NEBU: 2.5000 mg | INHALATION_SOLUTION | Freq: Two times a day (BID) | RESPIRATORY_TRACT | 12 refills | Status: AC

## 2018-03-22 MED ORDER — ALBUTEROL SULFATE (2.5 MG/3ML) 0.083% IN NEBU: 2.5000 mg | INHALATION_SOLUTION | Freq: Two times a day (BID) | RESPIRATORY_TRACT | Status: DC

## 2018-03-22 MED ORDER — HYDROCODONE-HOMATROPINE 5-1.5 MG/5ML PO SYRP: 5.0000 mL | ORAL_SOLUTION | Freq: Four times a day (QID) | ORAL | 0 refills | Status: AC | PRN

## 2018-03-22 MED ORDER — PREDNISONE 20 MG PO TABS: 40.0000 mg | ORAL_TABLET | Freq: Every day | ORAL | 0 refills | Status: AC

## 2018-03-22 MED ORDER — PREDNISONE 20 MG PO TABS: 40.0000 mg | ORAL_TABLET | Freq: Every day | ORAL | Status: DC

## 2018-03-22 NOTE — Progress Notes (Signed)
SATURATION QUALIFICATIONS: (This note is used to comply with regulatory documentation for home oxygen)  Patient Saturations on Room Air at Rest = 96%  Patient Saturations on Room Air while Ambulating = 93%  Please briefly explain why patient needs home oxygen: Patient will not require home O2, pt's sats stayed above 93% while ambulating on RA.  Riley Kill RN

## 2018-03-22 NOTE — Progress Notes (Signed)
Patient Inspiratory flow checked. Inspiratory flow adequate for effective medication administration. 40 L/Min.

## 2018-03-22 NOTE — Discharge Summary (Signed)
Physician Discharge Summary  Ann Fowler YTK:[id number] DOB: [birth year] DOA: 03/21/2018  PCP: Redmond School, MD  Admit date: 03/21/2018 Discharge date: 03/22/2018  Admitted From: home Disposition:  home  Recommendations for Outpatient Follow-up: Follow up with PCP in 3 - 4 days; Dr. Lamonte Sakai from pulmonary in 2 weeks Home Health:no Equipment/Devices: pt has home nebulizer  Discharge Condition:improved CODE STATUS:full Diet recommendation: [place] [facility] Summary:  Ann Fowler  is a 72 y.o. female, w Hypothyroidism, Copd, (not on home o2), apparently presents with cough for the past 3 days as well as wheezing, dyspnea. cough apparently now has green sputum.   Pt went to urgent care for evaluation and sent to ER.   In Ed,  CXR IMPRESSION: Mild hyperinflation consistent with reactive airway disease or COPD.  No pneumonia, CHF, nor other acute cardiopulmonary abnormality.  Thoracic aortic atherosclerosis.   Na 139, K 3.8, Bun 7, Creatinine 0.80 Wbc 9.1, hgb 14.8, Plt 225  Pox apparently 87% with ambulation and not improved with breathing treatments in ER.   Patient was admitted to the hospital due to hypoxemia placed in observation.  Overnight she received nebulizers, steroids, oxygen and improved significantly.  This morning she ambulated on room air with oxygen saturations of 93% while ambulating on the day of discharge.  She will discharge home with 5 days of oral prednisone, albuterol nebulizers every 12 hours for 5 days, albuterol inhaler every 6 hours as needed and follow-up with Dr. Lamonte Sakai from pulmonary as well as Dr. Riley Kill her primary care physician.  Patient and husband are in agreement with the plans as described.  Discharge diagnosis, prognosis, plans, follow-up and medications as well as treatments discussed with the patient and her husband.  They are in agreement with the plan.  Discharge Diagnoses:  Principal Problem:   Asthma, chronic, unspecified  asthma severity, with acute exacerbation    Discharge Instructions  Discharge Instructions    Call MD for:  difficulty breathing, headache or visual disturbances   Complete by:  As directed    Call MD for:  temperature >100.4   Complete by:  As directed    Diet - low sodium heart healthy   Complete by:  As directed    Discharge instructions   Complete by:  As directed    Take medications as prescribed and make follow up appointment with your Pulmonologist, Dr Lamonte Sakai to be seen in the next 1 week   Increase activity slowly   Complete by:  As directed      Allergies as of 03/22/2018   No Known Allergies     Medication List    STOP taking these medications   albuterol 108 (90 Base) MCG/ACT inhaler Commonly known as:  PROVENTIL HFA;VENTOLIN HFA Replaced by:  albuterol (2.5 MG/3ML) 0.083% nebulizer solution     TAKE these medications   albuterol (2.5 MG/3ML) 0.083% nebulizer solution Commonly known as:  PROVENTIL Take 3 mLs (2.5 mg total) by nebulization every 12 (twelve) hours for 5 days. Replaces:  albuterol 108 (90 Base) MCG/ACT inhaler   albuterol (2.5 MG/3ML) 0.083% nebulizer solution Commonly known as:  PROVENTIL Inhale 3 mLs (2.5 mg total) into the lungs every 6 (six) hours as needed for wheezing or shortness of breath.   ALPRAZolam 0.5 MG tablet Commonly known as:  XANAX Take 0.5 mg by mouth every 6 (six) hours as needed for anxiety.   cyanocobalamin 1000 MCG/ML injection Commonly known as:  (VITAMIN B-12) Inject 1,000 mcg into  the muscle every 30 (thirty) days.   estradiol 0.1 MG/GM vaginal cream Commonly known as:  ESTRACE Place 1 Applicatorful vaginally See admin instructions. Insert 1 applicatorful three times a month   HYDROcodone-homatropine 5-1.5 MG/5ML syrup Commonly known as:  HYCODAN Take 5 mLs by mouth every 6 (six) hours as needed for up to 5 days for cough.   ibuprofen 200 MG tablet Commonly known as:  ADVIL,MOTRIN Take 200 mg by mouth every 6  (six) hours as needed (for pain or headaches).   levothyroxine 75 MCG tablet Commonly known as:  SYNTHROID, LEVOTHROID Take 75 mcg by mouth daily.   predniSONE 20 MG tablet Commonly known as:  DELTASONE Take 2 tablets (40 mg total) by mouth daily with breakfast for 4 days. Start taking on:  03/23/2018       No Known Allergies  Consultations:  None   Procedures/Studies: Dg Chest 2 View  Result Date: 03/21/2018 CLINICAL DATA:  SOB, productive cough, nonsmoker. EXAM: CHEST - 2 VIEW COMPARISON:  Chest x-ray of June 20, 2013 FINDINGS: The lungs are mildly hyperinflated. There is no focal infiltrate. There is no pleural effusion. The heart and pulmonary vascularity are normal. The mediastinum is normal in width. The trachea is midline. There is calcification in the wall of the aortic arch. The bony thorax exhibits no acute abnormality. IMPRESSION: Mild hyperinflation consistent with reactive airway disease or COPD. No pneumonia, CHF, nor other acute cardiopulmonary abnormality. Thoracic aortic atherosclerosis. Electronically Signed   By: David  Martinique M.D.   On: 03/21/2018 13:18      Subjective: Standing up on the side of the bed in no acute distress   Discharge Exam: Vitals:   03/21/18 2350 03/22/18 0732  BP: 115/69   Pulse: 89 80  Resp: 17 (!) 24  Temp: 98 F (36.7 C)   SpO2: 96% 93%   Vitals:   03/21/18 2133 03/21/18 2350 03/22/18 0457 03/22/18 0732  BP: 126/84 115/69    Pulse: 88 89  80  Resp: 18 17  (!) 24  Temp: 97.6 F (36.4 C) 98 F (36.7 C)    TempSrc: Oral Oral    SpO2: 95% 96%  93%  Weight:   59.9 kg   Height:        General: Pt is alert, awake, not in acute distress Cardiovascular: RRR, S1/S2 +, no rubs, no gallops Respiratory: CTA bilaterally, no wheezing, no rhonchi Abdominal: Soft, NT, ND, bowel sounds + Extremities: no edema, no cyanosis    The results of significant diagnostics from this hospitalization (including imaging, microbiology,  ancillary and laboratory) are listed below for reference.     Microbiology: No results found for this or any previous visit (from the past 240 hour(s)).   Labs: BNP (last 3 results) No results for input(s): BNP in the last 8760 hours. Basic Metabolic Panel: Recent Labs  Lab 03/21/18 1653 03/22/18 0250  NA 139 139  K 3.8 3.9  CL 102 105  CO2 25 24  GLUCOSE 139* 220*  BUN 7* 6*  CREATININE 0.80 0.69  CALCIUM 9.6 9.2   Liver Function Tests: Recent Labs  Lab 03/22/18 0250  AST 19  ALT 14  ALKPHOS 81  BILITOT 0.9  PROT 6.5  ALBUMIN 3.8   No results for input(s): LIPASE, AMYLASE in the last 168 hours. No results for input(s): AMMONIA in the last 168 hours. CBC: Recent Labs  Lab 03/21/18 1653 03/22/18 0250  WBC 9.1 4.3  NEUTROABS 6.6  --  HGB 14.8 14.1  HCT 45.8 43.3  MCV 97.4 96.0  PLT 225 209   Cardiac Enzymes: No results for input(s): CKTOTAL, CKMB, CKMBINDEX, TROPONINI in the last 168 hours. BNP: Invalid input(s): POCBNP CBG: No results for input(s): GLUCAP in the last 168 hours. D-Dimer No results for input(s): DDIMER in the last 72 hours. Hgb A1c No results for input(s): HGBA1C in the last 72 hours. Lipid Profile No results for input(s): CHOL, HDL, LDLCALC, TRIG, CHOLHDL, LDLDIRECT in the last 72 hours. Thyroid function studies No results for input(s): TSH, T4TOTAL, T3FREE, THYROIDAB in the last 72 hours.  Invalid input(s): FREET3 Anemia work up No results for input(s): VITAMINB12, FOLATE, FERRITIN, TIBC, IRON, RETICCTPCT in the last 72 hours. Urinalysis No results found for: COLORURINE, APPEARANCEUR, LABSPEC, [place], GLUCOSEU, HGBUR, BILIRUBINUR, KETONESUR, PROTEINUR, UROBILINOGEN, NITRITE, LEUKOCYTESUR Sepsis Labs Invalid input(s): PROCALCITONIN,  WBC,  LACTICIDVEN Microbiology No results found for this or any previous visit (from the past 240 hour(s)).   Time coordinating discharge: Over 30 minutes  SIGNED:   Lady Deutscher,  MD  [facility] 03/22/2018, 11:32 AM Pager   If 7PM-7AM, please contact night-coverage [email or web address] Password [email or web address]

## 2018-03-28 NOTE — ED Provider Notes (Signed)
[facility] Provider Note   CSN: [id number] Arrival date & time: 03/21/18  1347     History   Chief Complaint Chief Complaint  Patient presents with  . Cough    HPI Ann Fowler is a 72 y.o. female.  HPI Patient presents to the emergency department with wheezing coughing shortness of breath.  The patient states that this started 3 days ago.  Patient states that she has had similar episodes in the past.  Patient states that she did not take any medications prior to arrival.  The patient states that nothing seems to make her condition better or worse.  The patient denies chest pain,  headache,blurred vision, neck pain, fever,  weakness, numbness, dizziness, anorexia, edema, abdominal pain, nausea, vomiting, diarrhea, rash, back pain, dysuria, hematemesis, bloody stool, near syncope, or syncope. Past Medical History:  Diagnosis Date  . Allergy to environmental factors   . Anxiety   . COPD (chronic obstructive pulmonary disease) ([place])   . Thyroid disease     Patient Active Problem List   Diagnosis Date Noted  . Asthma, chronic, unspecified asthma severity, with acute exacerbation 03/21/2018  . Asthma 05/12/2017  . Allergic rhinitis 05/12/2017    Past Surgical History:  Procedure Laterality Date  . NECK SURGERY       OB History   None      Home Medications    Prior to Admission medications   Medication Sig Start Date End Date Taking? Authorizing Provider  ALPRAZolam Duanne Moron) 0.5 MG tablet Take 0.5 mg by mouth every 6 (six) hours as needed for anxiety.  02/20/18  Yes [provider]  cyanocobalamin (,VITAMIN B-12,) 1000 MCG/ML injection Inject 1,000 mcg into the muscle every 30 (thirty) days.  03/09/18  Yes [provider]  estradiol (ESTRACE) 0.1 MG/GM vaginal cream Place 1 Applicatorful vaginally See admin instructions. Insert 1 applicatorful three times a month 02/13/18  Yes [provider]  ibuprofen (ADVIL,MOTRIN) 200  MG tablet Take 200 mg by mouth every 6 (six) hours as needed (for pain or headaches).   Yes [provider]  levothyroxine (SYNTHROID, LEVOTHROID) 75 MCG tablet Take 75 mcg by mouth daily. 03/11/18  Yes [provider]  albuterol (PROVENTIL) (2.5 MG/3ML) 0.083% nebulizer solution Take 3 mLs (2.5 mg total) by nebulization every 12 (twelve) hours for 5 days. 03/22/18 03/27/18  Lady Deutscher, MD  albuterol (PROVENTIL) (2.5 MG/3ML) 0.083% nebulizer solution Inhale 3 mLs (2.5 mg total) into the lungs every 6 (six) hours as needed for wheezing or shortness of breath. 03/22/18   Lady Deutscher, MD    Family History Family History  Problem Relation Age of Onset  . Cancer Mother   . Liver cancer Father     Social History Social History   Tobacco Use  . Smoking status: Never Smoker  . Smokeless tobacco: Never Used  Substance Use Topics  . Alcohol use: Yes  . Drug use: No     Allergies   Patient has no known allergies.   Review of Systems Review of Systems All other systems negative except as documented in the HPI. All pertinent positives and negatives as reviewed in the HPI.  Physical Exam Updated Vital Signs BP 115/69 (BP Location: Left Arm)   Pulse 80   Temp 98 F (36.7 C) (Oral)   Resp (!) 24   Ht 5\' 7"  (1.702 m)   Wt 59.9 kg   SpO2 93%   BMI 20.69 kg/m  Physical Exam  Constitutional: She is oriented to person, place, and time. She appears well-developed and well-nourished. No distress.  HENT:  Head: Normocephalic and atraumatic.  Mouth/Throat: Oropharynx is clear and moist.  Eyes: Pupils are equal, round, and reactive to light.  Neck: Normal range of motion. Neck supple.  Cardiovascular: Normal rate, regular rhythm and normal heart sounds. Exam reveals no gallop and no friction rub.  No murmur heard. Pulmonary/Chest: Effort normal. Tachypnea noted. No respiratory distress. She has wheezes.  Abdominal: Soft. Bowel sounds are normal. She exhibits  no distension. There is no tenderness.  Neurological: She is alert and oriented to person, place, and time. She exhibits normal muscle tone. Coordination normal.  Skin: Skin is warm and dry. Capillary refill takes less than 2 seconds. No rash noted. No erythema.  Psychiatric: She has a normal mood and affect. Her behavior is normal.  Nursing note and vitals reviewed.    ED Treatments / Results  Labs (all labs ordered are listed, but only abnormal results are displayed) Labs Reviewed  BASIC METABOLIC PANEL - Abnormal; Notable for the following components:      Result Value   Glucose, Bld 139 (*)    BUN 7 (*)    All other components within normal limits  COMPREHENSIVE METABOLIC PANEL - Abnormal; Notable for the following components:   Glucose, Bld 220 (*)    BUN 6 (*)    All other components within normal limits  CBC WITH DIFFERENTIAL/PLATELET  CBC    EKG None  Radiology No results found.  Procedures Procedures (including critical care time)  Medications Ordered in ED Medications  albuterol (PROVENTIL,VENTOLIN) solution continuous neb (0 mg/hr Nebulization Stopped 03/21/18 1851)  albuterol (PROVENTIL) (2.5 MG/3ML) 0.083% nebulizer solution 5 mg (5 mg Nebulization Given 03/21/18 1550)  ipratropium (ATROVENT) nebulizer solution 0.5 mg (0.5 mg Nebulization Given 03/21/18 1551)  methylPREDNISolone sodium succinate (SOLU-MEDROL) 125 mg/2 mL injection 125 mg (125 mg Intravenous Given 03/21/18 1849)     Initial Impression / Assessment and Plan / ED Course  I have reviewed the triage vital signs and the nursing notes.  Pertinent labs & imaging results that were available during my care of the patient were reviewed by me and considered in my medical decision making (see chart for details).     Patient will need admission for further evaluation care of her hypoxia most likely due to a COPD type situation.  The patient is advised that the need for admission and all questions were  answered.  Final Clinical Impressions(s) / ED Diagnoses   Final diagnoses:  None    ED Discharge Orders         Ordered    predniSONE (DELTASONE) 20 MG tablet  Daily with breakfast     03/22/18 1130    albuterol (PROVENTIL) (2.5 MG/3ML) 0.083% nebulizer solution  Every 12 hours     03/22/18 1130    albuterol (PROVENTIL) (2.5 MG/3ML) 0.083% nebulizer solution  Every 6 hours PRN     03/22/18 1130    HYDROcodone-homatropine (HYCODAN) 5-1.5 MG/5ML syrup  Every 6 hours PRN     03/22/18 1130    Increase activity slowly     03/22/18 1130    Diet - low sodium heart healthy     03/22/18 1130    Discharge instructions    Comments:  Take medications as prescribed and make follow up appointment with your Pulmonologist, Dr Lamonte Sakai to be seen in the next 1 week   03/22/18 1130  Call MD for:  temperature >100.4     03/22/18 1130    Call MD for:  difficulty breathing, headache or visual disturbances     03/22/18 1130           Dalia Heading, PA-C 03/28/18 1627    Charlesetta Shanks, MD 03/29/18 [phone]

## 2018-03-29 DIAGNOSIS — R0902 Hypoxemia: Secondary | ICD-10-CM | POA: Diagnosis not present

## 2018-03-29 DIAGNOSIS — E063 Autoimmune thyroiditis: Secondary | ICD-10-CM | POA: Diagnosis not present

## 2018-03-29 DIAGNOSIS — E538 Deficiency of other specified B group vitamins: Secondary | ICD-10-CM | POA: Diagnosis not present

## 2018-03-29 DIAGNOSIS — E039 Hypothyroidism, unspecified: Secondary | ICD-10-CM | POA: Diagnosis not present

## 2018-03-29 DIAGNOSIS — J9690 Respiratory failure, unspecified, unspecified whether with hypoxia or hypercapnia: Secondary | ICD-10-CM | POA: Diagnosis not present

## 2018-03-29 DIAGNOSIS — J449 Chronic obstructive pulmonary disease, unspecified: Secondary | ICD-10-CM | POA: Diagnosis not present

## 2018-03-29 DIAGNOSIS — Z23 Encounter for immunization: Secondary | ICD-10-CM | POA: Diagnosis not present

## 2018-03-29 DIAGNOSIS — Z6821 Body mass index (BMI) 21.0-21.9, adult: Secondary | ICD-10-CM | POA: Diagnosis not present

## 2018-03-29 DIAGNOSIS — J45901 Unspecified asthma with (acute) exacerbation: Secondary | ICD-10-CM | POA: Diagnosis not present

## 2018-03-30 ENCOUNTER — Inpatient Hospital Stay: Payer: Self-pay | Admitting: Primary Care

## 2018-04-23 DIAGNOSIS — Z1231 Encounter for screening mammogram for malignant neoplasm of breast: Secondary | ICD-10-CM | POA: Diagnosis not present

## 2018-05-29 DIAGNOSIS — E063 Autoimmune thyroiditis: Secondary | ICD-10-CM | POA: Diagnosis not present

## 2018-06-01 DIAGNOSIS — J449 Chronic obstructive pulmonary disease, unspecified: Secondary | ICD-10-CM | POA: Diagnosis not present

## 2018-06-01 DIAGNOSIS — G4700 Insomnia, unspecified: Secondary | ICD-10-CM | POA: Diagnosis not present

## 2018-06-01 DIAGNOSIS — E063 Autoimmune thyroiditis: Secondary | ICD-10-CM | POA: Diagnosis not present

## 2018-06-01 DIAGNOSIS — Z6821 Body mass index (BMI) 21.0-21.9, adult: Secondary | ICD-10-CM | POA: Diagnosis not present

## 2018-06-01 DIAGNOSIS — Z1389 Encounter for screening for other disorder: Secondary | ICD-10-CM | POA: Diagnosis not present

## 2018-07-27 DIAGNOSIS — E039 Hypothyroidism, unspecified: Secondary | ICD-10-CM | POA: Diagnosis not present

## 2018-09-17 DIAGNOSIS — M79671 Pain in right foot: Secondary | ICD-10-CM | POA: Diagnosis not present

## 2018-09-17 DIAGNOSIS — M7740 Metatarsalgia, unspecified foot: Secondary | ICD-10-CM | POA: Diagnosis not present

## 2018-09-17 DIAGNOSIS — L851 Acquired keratosis [keratoderma] palmaris et plantaris: Secondary | ICD-10-CM | POA: Diagnosis not present

## 2018-10-02 ENCOUNTER — Telehealth: Payer: Self-pay | Admitting: Emergency Medicine

## 2018-10-02 MED ORDER — ALBUTEROL SULFATE (2.5 MG/3ML) 0.083% IN NEBU: 2.5000 mg | INHALATION_SOLUTION | Freq: Four times a day (QID) | RESPIRATORY_TRACT | 0 refills | Status: AC | PRN

## 2018-10-02 MED ORDER — ALBUTEROL SULFATE (2.5 MG/3ML) 0.083% IN NEBU: 2.5000 mg | INHALATION_SOLUTION | Freq: Four times a day (QID) | RESPIRATORY_TRACT | 5 refills | Status: DC | PRN

## 2018-10-02 NOTE — Telephone Encounter (Signed)
Returned phone call to patient, requesting refill of albuterol nebulizer solution. Made aware she is due for her annual visit. States she will call back to schedule. Refill sent. Nothing further is needed at this time.

## 2018-12-24 DIAGNOSIS — F419 Anxiety disorder, unspecified: Secondary | ICD-10-CM | POA: Diagnosis not present

## 2018-12-24 DIAGNOSIS — Z6821 Body mass index (BMI) 21.0-21.9, adult: Secondary | ICD-10-CM | POA: Diagnosis not present

## 2018-12-24 DIAGNOSIS — N952 Postmenopausal atrophic vaginitis: Secondary | ICD-10-CM | POA: Diagnosis not present

## 2018-12-24 DIAGNOSIS — Z124 Encounter for screening for malignant neoplasm of cervix: Secondary | ICD-10-CM | POA: Diagnosis not present

## 2019-04-03 DIAGNOSIS — D510 Vitamin B12 deficiency anemia due to intrinsic factor deficiency: Secondary | ICD-10-CM | POA: Diagnosis not present

## 2019-04-03 DIAGNOSIS — Z1389 Encounter for screening for other disorder: Secondary | ICD-10-CM | POA: Diagnosis not present

## 2019-04-03 DIAGNOSIS — E063 Autoimmune thyroiditis: Secondary | ICD-10-CM | POA: Diagnosis not present

## 2019-04-03 DIAGNOSIS — J45909 Unspecified asthma, uncomplicated: Secondary | ICD-10-CM | POA: Diagnosis not present

## 2019-04-03 DIAGNOSIS — Z0000 Encounter for general adult medical examination without abnormal findings: Secondary | ICD-10-CM | POA: Diagnosis not present

## 2019-04-03 DIAGNOSIS — Z6820 Body mass index (BMI) 20.0-20.9, adult: Secondary | ICD-10-CM | POA: Diagnosis not present

## 2019-05-09 DIAGNOSIS — E063 Autoimmune thyroiditis: Secondary | ICD-10-CM | POA: Diagnosis not present

## 2019-05-30 DIAGNOSIS — Z20828 Contact with and (suspected) exposure to other viral communicable diseases: Secondary | ICD-10-CM | POA: Diagnosis not present

## 2019-06-12 DIAGNOSIS — E063 Autoimmune thyroiditis: Secondary | ICD-10-CM | POA: Diagnosis not present

## 2019-06-24 ENCOUNTER — Other Ambulatory Visit: Payer: Self-pay

## 2019-06-24 DIAGNOSIS — Z20822 Contact with and (suspected) exposure to covid-19: Secondary | ICD-10-CM

## 2019-06-24 DIAGNOSIS — Z20828 Contact with and (suspected) exposure to other viral communicable diseases: Secondary | ICD-10-CM | POA: Diagnosis not present

## 2019-06-26 LAB — NOVEL CORONAVIRUS, NAA: SARS-CoV-2, NAA: NOT DETECTED

## 2019-08-07 ENCOUNTER — Ambulatory Visit: Payer: Medicare Other | Attending: Internal Medicine

## 2019-08-07 DIAGNOSIS — Z23 Encounter for immunization: Secondary | ICD-10-CM | POA: Diagnosis not present

## 2019-08-07 NOTE — Progress Notes (Signed)
   Covid-19 Vaccination Clinic  Name:  ROCKET STEFANEK    MRN: [id number] DOB: 07/11/1946  08/07/2019  Ms. Seaward was observed post Covid-19 immunization for 15 minutes without incidence. She was provided with Vaccine Information Sheet and instruction to access the V-Safe system.   Ms. Lambeth was instructed to call 911 with any severe reactions post vaccine: Marland Kitchen Difficulty breathing  . Swelling of your face and throat  . A fast heartbeat  . A bad rash all over your body  . Dizziness and weakness    Immunizations Administered    Name Date Dose VIS Date Route   Pfizer COVID-19 Vaccine 08/07/2019  9:27 AM 0.3 mL 06/28/2019 Intramuscular   Manufacturer: [place]   Lot: [id number]   [employer]: [id number]

## 2019-08-25 ENCOUNTER — Ambulatory Visit: Payer: Medicare Other | Attending: Internal Medicine

## 2019-08-25 DIAGNOSIS — Z23 Encounter for immunization: Secondary | ICD-10-CM | POA: Insufficient documentation

## 2019-08-25 NOTE — Progress Notes (Signed)
   Covid-19 Vaccination Clinic  Name:  Ann Fowler    MRN: [id number] DOB: 1945/12/19  08/25/2019  Ms. Toll was observed post Covid-19 immunization for 15 minutes without incidence. She was provided with Vaccine Information Sheet and instruction to access the V-Safe system.   [name] was instructed to call 911 with any severe reactions post vaccine: Marland Kitchen Difficulty breathing  . Swelling of your face and throat  . A fast heartbeat  . A bad rash all over your body  . Dizziness and weakness    Immunizations Administered    Name Date Dose VIS Date Route   Pfizer COVID-19 Vaccine 08/25/2019 12:04 PM 0.3 mL 06/28/2019 Intramuscular   Manufacturer: [place]   Lot: [id number]   [place]: [id number]

## 2019-09-05 ENCOUNTER — Ambulatory Visit: Payer: Medicare Other

## 2019-10-03 DIAGNOSIS — L819 Disorder of pigmentation, unspecified: Secondary | ICD-10-CM | POA: Diagnosis not present

## 2019-10-03 DIAGNOSIS — L570 Actinic keratosis: Secondary | ICD-10-CM | POA: Diagnosis not present

## 2019-10-03 DIAGNOSIS — L814 Other melanin hyperpigmentation: Secondary | ICD-10-CM | POA: Diagnosis not present

## 2019-10-03 DIAGNOSIS — L821 Other seborrheic keratosis: Secondary | ICD-10-CM | POA: Diagnosis not present

## 2019-12-26 DIAGNOSIS — N952 Postmenopausal atrophic vaginitis: Secondary | ICD-10-CM | POA: Diagnosis not present

## 2019-12-26 DIAGNOSIS — Z6821 Body mass index (BMI) 21.0-21.9, adult: Secondary | ICD-10-CM | POA: Diagnosis not present

## 2019-12-26 DIAGNOSIS — Z01419 Encounter for gynecological examination (general) (routine) without abnormal findings: Secondary | ICD-10-CM | POA: Diagnosis not present

## 2020-04-03 DIAGNOSIS — Z0001 Encounter for general adult medical examination with abnormal findings: Secondary | ICD-10-CM | POA: Diagnosis not present

## 2020-04-03 DIAGNOSIS — J301 Allergic rhinitis due to pollen: Secondary | ICD-10-CM | POA: Diagnosis not present

## 2020-04-03 DIAGNOSIS — Z1389 Encounter for screening for other disorder: Secondary | ICD-10-CM | POA: Diagnosis not present

## 2020-04-03 DIAGNOSIS — Z681 Body mass index (BMI) 19 or less, adult: Secondary | ICD-10-CM | POA: Diagnosis not present

## 2020-04-03 DIAGNOSIS — F419 Anxiety disorder, unspecified: Secondary | ICD-10-CM | POA: Diagnosis not present

## 2020-04-03 DIAGNOSIS — Z23 Encounter for immunization: Secondary | ICD-10-CM | POA: Diagnosis not present

## 2020-05-18 DIAGNOSIS — E063 Autoimmune thyroiditis: Secondary | ICD-10-CM | POA: Diagnosis not present

## 2020-08-19 DIAGNOSIS — E063 Autoimmune thyroiditis: Secondary | ICD-10-CM | POA: Diagnosis not present

## 2020-08-19 DIAGNOSIS — F419 Anxiety disorder, unspecified: Secondary | ICD-10-CM | POA: Diagnosis not present

## 2021-03-29 DIAGNOSIS — M2041 Other hammer toe(s) (acquired), right foot: Secondary | ICD-10-CM | POA: Diagnosis not present

## 2021-03-29 DIAGNOSIS — L309 Dermatitis, unspecified: Secondary | ICD-10-CM | POA: Diagnosis not present

## 2021-03-29 DIAGNOSIS — R21 Rash and other nonspecific skin eruption: Secondary | ICD-10-CM | POA: Diagnosis not present

## 2021-03-29 DIAGNOSIS — L851 Acquired keratosis [keratoderma] palmaris et plantaris: Secondary | ICD-10-CM | POA: Diagnosis not present

## 2021-04-15 ENCOUNTER — Ambulatory Visit (INDEPENDENT_AMBULATORY_CARE_PROVIDER_SITE_OTHER): Payer: Medicare Other | Admitting: Podiatry

## 2021-04-15 ENCOUNTER — Other Ambulatory Visit: Payer: Self-pay

## 2021-04-15 ENCOUNTER — Ambulatory Visit (INDEPENDENT_AMBULATORY_CARE_PROVIDER_SITE_OTHER): Payer: Medicare Other

## 2021-04-15 DIAGNOSIS — M79671 Pain in right foot: Secondary | ICD-10-CM

## 2021-04-15 DIAGNOSIS — M21619 Bunion of unspecified foot: Secondary | ICD-10-CM

## 2021-04-15 DIAGNOSIS — M21611 Bunion of right foot: Secondary | ICD-10-CM | POA: Diagnosis not present

## 2021-04-15 DIAGNOSIS — L84 Corns and callosities: Secondary | ICD-10-CM

## 2021-04-15 DIAGNOSIS — R21 Rash and other nonspecific skin eruption: Secondary | ICD-10-CM

## 2021-04-15 MED ORDER — TRIAMCINOLONE ACETONIDE 0.1 % EX CREA: 1.0000 "application " | TOPICAL_CREAM | Freq: Two times a day (BID) | CUTANEOUS | 0 refills | Status: DC

## 2021-04-16 NOTE — Progress Notes (Signed)
Subjective:   Patient ID: Ann Fowler, female   DOB: 75 y.o.   MRN: [id number]   HPI 75 year old female presents the office for concerns of peeling skin along the bunion area of her right foot.  She said that she notices after she went for a walk.  She states that she likes to walk quite a bit.  She did not get the symptoms in the left foot.  She saw another doctor and she was given Lotrisone cream.  She states this started off as small red bumps.  She states that she can apply it daily and it may be helping some.  Also gets calluses the ball of her foot causing discomfort.  No recent injury.  No other concerns.   Review of Systems  All other systems reviewed and are negative.  Past Medical History:  Diagnosis Date   Allergy to environmental factors    Anxiety    COPD (chronic obstructive pulmonary disease) ([place])    Thyroid disease     Past Surgical History:  Procedure Laterality Date   NECK SURGERY       Current Outpatient Medications:    triamcinolone cream (KENALOG) 0.1 %, Apply 1 application topically 2 (two) times daily., Disp: 30 g, Rfl: 0   albuterol (PROVENTIL) (2.5 MG/3ML) 0.083% nebulizer solution, Take 3 mLs (2.5 mg total) by nebulization every 12 (twelve) hours for 5 days., Disp: 75 mL, Rfl: 12   albuterol (PROVENTIL) (2.5 MG/3ML) 0.083% nebulizer solution, Inhale 3 mLs (2.5 mg total) into the lungs every 6 (six) hours as needed for wheezing or shortness of breath., Disp: 75 mL, Rfl: 0   ALPRAZolam (XANAX) 0.5 MG tablet, Take 0.5 mg by mouth every 6 (six) hours as needed for anxiety. , Disp: , Rfl: 3   cyanocobalamin (,VITAMIN B-12,) 1000 MCG/ML injection, Inject 1,000 mcg into the muscle every 30 (thirty) days. , Disp: , Rfl: 9   estradiol (ESTRACE) 0.1 MG/GM vaginal cream, Place 1 Applicatorful vaginally See admin instructions. Insert 1 applicatorful three times a month, Disp: , Rfl: 5   ibuprofen (ADVIL,MOTRIN) 200 MG tablet, Take 200 mg by mouth every 6 (six) hours as  needed (for pain or headaches)., Disp: , Rfl:    levothyroxine (SYNTHROID, LEVOTHROID) 75 MCG tablet, Take 75 mcg by mouth daily., Disp: , Rfl: 3  No Known Allergies       Objective:  Physical Exam  General: AAO x3, NAD  Dermatological: On the right foot on the medial first MPJ and also slightly proximal to this area are small faint red erythematous bumps with some peeling skin present.  Does not itch.  There is no excoriations.  There is no open sore there is no drainage or any pustules.  Hyperkeratotic lesion submetatarsal 2 bilaterally left side worse than right.  Upon debridement small amount of dried blood present on the left side but there is no evidence of any puncture wound or foreign body.  No signs of infection.  Vascular: Dorsalis Pedis artery and Posterior Tibial artery pedal pulses are 2/4 bilateral with immedate capillary fill time.  There is no pain with calf compression, swelling, warmth, erythema.   Neruologic: Grossly intact via light touch bilateral.   Musculoskeletal: Bunion present bilaterally.  Muscular strength 5/5 in all groups tested bilateral.  Gait: Unassisted, Nonantalgic.       Assessment:   Skin rash, likely dermatitis right side; hyperkeratotic lesions     Plan:  -Treatment options discussed including all alternatives, risks, and  complications -Etiology of symptoms were discussed -X-rays obtained reviewed.  No evidence of acute fracture.  Bunions are present. -Given skin rashes Athlete's foot which may be helping some but still present.  I prescribed a steroid cream for her today.  I dispensed offloading for the bunion.  Sharp debrided the hyperkeratotic lesions x2 without any complications or bleeding.  Recommend moisturizer daily.  Trula Slade DPM

## 2021-05-03 DIAGNOSIS — E062 Chronic thyroiditis with transient thyrotoxicosis: Secondary | ICD-10-CM | POA: Diagnosis not present

## 2021-05-03 DIAGNOSIS — Z9229 Personal history of other drug therapy: Secondary | ICD-10-CM | POA: Diagnosis not present

## 2022-02-23 DIAGNOSIS — L821 Other seborrheic keratosis: Secondary | ICD-10-CM | POA: Diagnosis not present

## 2022-02-23 DIAGNOSIS — D692 Other nonthrombocytopenic purpura: Secondary | ICD-10-CM | POA: Diagnosis not present

## 2022-04-01 ENCOUNTER — Encounter (HOSPITAL_COMMUNITY): Payer: Self-pay | Admitting: *Deleted

## 2022-04-01 ENCOUNTER — Emergency Department (HOSPITAL_COMMUNITY)
Admission: EM | Admit: 2022-04-01 | Discharge: 2022-04-01 | Disposition: A | Discharge status: Home / Self Care | Payer: Medicare PPO | Attending: Emergency Medicine | Admitting: Emergency Medicine

## 2022-04-01 ENCOUNTER — Other Ambulatory Visit: Payer: Self-pay

## 2022-04-01 ENCOUNTER — Emergency Department (HOSPITAL_COMMUNITY): Payer: Medicare PPO

## 2022-04-01 DIAGNOSIS — S22060A Wedge compression fracture of T7-T8 vertebra, initial encounter for closed fracture: Secondary | ICD-10-CM

## 2022-04-01 DIAGNOSIS — Y92000 Kitchen of unspecified non-institutional (private) residence as  the place of occurrence of the external cause: Secondary | ICD-10-CM | POA: Diagnosis not present

## 2022-04-01 DIAGNOSIS — R9082 White matter disease, unspecified: Secondary | ICD-10-CM | POA: Diagnosis not present

## 2022-04-01 DIAGNOSIS — R55 Syncope and collapse: Secondary | ICD-10-CM | POA: Insufficient documentation

## 2022-04-01 DIAGNOSIS — W1839XA Other fall on same level, initial encounter: Secondary | ICD-10-CM | POA: Insufficient documentation

## 2022-04-01 DIAGNOSIS — Z043 Encounter for examination and observation following other accident: Secondary | ICD-10-CM | POA: Diagnosis not present

## 2022-04-01 DIAGNOSIS — S299XXA Unspecified injury of thorax, initial encounter: Secondary | ICD-10-CM | POA: Diagnosis present

## 2022-04-01 DIAGNOSIS — S22069A Unspecified fracture of T7-T8 vertebra, initial encounter for closed fracture: Secondary | ICD-10-CM | POA: Diagnosis not present

## 2022-04-01 DIAGNOSIS — Z79899 Other long term (current) drug therapy: Secondary | ICD-10-CM | POA: Diagnosis not present

## 2022-04-01 DIAGNOSIS — W19XXXA Unspecified fall, initial encounter: Secondary | ICD-10-CM

## 2022-04-01 DIAGNOSIS — M47814 Spondylosis without myelopathy or radiculopathy, thoracic region: Secondary | ICD-10-CM | POA: Diagnosis not present

## 2022-04-01 DIAGNOSIS — M546 Pain in thoracic spine: Secondary | ICD-10-CM | POA: Diagnosis not present

## 2022-04-01 LAB — RAPID URINE DRUG SCREEN, HOSP PERFORMED
Amphetamines: NOT DETECTED
Barbiturates: NOT DETECTED
Benzodiazepines: POSITIVE — AB
Cocaine: NOT DETECTED
Opiates: NOT DETECTED
Tetrahydrocannabinol: NOT DETECTED

## 2022-04-01 LAB — COMPREHENSIVE METABOLIC PANEL
ALT: 29 U/L (ref 0–44)
AST: 39 U/L (ref 15–41)
Albumin: 4.3 g/dL (ref 3.5–5.0)
Alkaline Phosphatase: 87 U/L (ref 38–126)
Anion gap: 11 (ref 5–15)
BUN: 9 mg/dL (ref 8–23)
CO2: 24 mmol/L (ref 22–32)
Calcium: 9.2 mg/dL (ref 8.9–10.3)
Chloride: 105 mmol/L (ref 98–111)
Creatinine, Ser: 0.56 mg/dL (ref 0.44–1.00)
GFR, Estimated: >60 mL/min (ref >60)
Glucose, Bld: 94 mg/dL (ref 70–99)
Potassium: 3.5 mmol/L (ref 3.5–5.1)
Sodium: 140 mmol/L (ref 135–145)
Total Bilirubin: 0.6 mg/dL (ref 0.3–1.2)
Total Protein: 7.2 g/dL (ref 6.5–8.1)

## 2022-04-01 LAB — CBC WITH DIFFERENTIAL/PLATELET
Abs Immature Granulocytes: 0.08 10*3/uL — ABNORMAL HIGH (ref 0.00–0.07)
Basophils Absolute: 0.1 10*3/uL (ref 0.0–0.1)
Basophils Relative: 0 %
Eosinophils Absolute: 0.3 10*3/uL (ref 0.0–0.5)
Eosinophils Relative: 3 %
HCT: 43.4 % (ref 36.0–46.0)
Hemoglobin: 14.5 g/dL (ref 12.0–15.0)
Immature Granulocytes: 1 %
Lymphocytes Relative: 14 %
Lymphs Abs: 1.6 10*3/uL (ref 0.7–4.0)
MCH: 33.5 pg (ref 26.0–34.0)
MCHC: 33.4 g/dL (ref 30.0–36.0)
MCV: 100.2 fL — ABNORMAL HIGH (ref 80.0–100.0)
Monocytes Absolute: 0.9 10*3/uL (ref 0.1–1.0)
Monocytes Relative: 7 %
Neutro Abs: 8.6 10*3/uL — ABNORMAL HIGH (ref 1.7–7.7)
Neutrophils Relative %: 75 %
Platelets: 249 10*3/uL (ref 150–400)
RBC: 4.33 MIL/uL (ref 3.87–5.11)
RDW: 14.1 % (ref 11.5–15.5)
WBC: 11.4 10*3/uL — ABNORMAL HIGH (ref 4.0–10.5)
nRBC: 0 % (ref 0.0–0.2)

## 2022-04-01 LAB — URINALYSIS, ROUTINE W REFLEX MICROSCOPIC
Bilirubin Urine: NEGATIVE
Glucose, UA: NEGATIVE mg/dL
Ketones, ur: 5 mg/dL — AB
Nitrite: NEGATIVE
Protein, ur: NEGATIVE mg/dL
Specific Gravity, Urine: 1.005 (ref 1.005–1.030)
pH: 6 (ref 5.0–8.0)

## 2022-04-01 LAB — ETHANOL: Alcohol, Ethyl (B): 34 mg/dL — ABNORMAL HIGH (ref <10)

## 2022-04-01 LAB — CBG MONITORING, ED: Glucose-Capillary: 91 mg/dL (ref 70–99)

## 2022-04-01 NOTE — ED Triage Notes (Addendum)
BIB family from home s/p fall in kitchen this am, c/o back pain. Not able to answer clarify well, or some questions well. Scattered thoughts and answers. Endorses hit head, lost consciousness, got up and went into bed and layed down. Given ibuprofen PTA. A&Ox3. Some confusion evident. Denies blood thinners. Unable to follow some commands. Answering questions with incorrectly and with other questions. Does not know or remember much.

## 2022-04-01 NOTE — ED Provider Notes (Signed)
[facility] EMERGENCY DEPARTMENT Provider Note   CSN: [id number] Arrival date & time: 04/01/22  1316     History  Chief Complaint  Patient presents with   Lytle Michaels    Ann Fowler is a 76 y.o. female.   Fall     Pt presents to the ED after a fall this am.  Husband states he came home and pt had complained of fall, complaining of pain in her back.  Pt has been able to get up and walk around.  Pt has difficulty telling me exactly what occurred.  She states she fell either today or yesterday.  When I ask her if she remembers when she fell she states of course she does but then cannot tell me the exact time.  Husband states it was this morning.  Pt denies having any memory trouble but husband indicates that this is not new (months to years).  She denies headache, chest pain.  No vomiting or diarrhea.  Pain is in the mid back.  Increases with palpation.  No numbness or weakness.  Home Medications Prior to Admission medications   Medication Sig Start Date End Date Taking? Authorizing Provider  albuterol (PROVENTIL) (2.5 MG/3ML) 0.083% nebulizer solution Take 3 mLs (2.5 mg total) by nebulization every 12 (twelve) hours for 5 days. 03/22/18 03/27/18  Lady Deutscher, MD  albuterol (PROVENTIL) (2.5 MG/3ML) 0.083% nebulizer solution Inhale 3 mLs (2.5 mg total) into the lungs every 6 (six) hours as needed for wheezing or shortness of breath. 10/02/18   Collene Gobble, MD  ALPRAZolam Duanne Moron) 0.5 MG tablet Take 0.5 mg by mouth every 6 (six) hours as needed for anxiety.  02/20/18   [provider]  cyanocobalamin (,VITAMIN B-12,) 1000 MCG/ML injection Inject 1,000 mcg into the muscle every 30 (thirty) days.  03/09/18   [provider]  estradiol (ESTRACE) 0.1 MG/GM vaginal cream Place 1 Applicatorful vaginally See admin instructions. Insert 1 applicatorful three times a month 02/13/18   [provider]  ibuprofen (ADVIL,MOTRIN) 200 MG tablet Take 200 mg by mouth every 6 (six)  hours as needed (for pain or headaches).    [provider]  levothyroxine (SYNTHROID, LEVOTHROID) 75 MCG tablet Take 75 mcg by mouth daily. 03/11/18   [provider]  triamcinolone cream (KENALOG) 0.1 % Apply 1 application topically 2 (two) times daily. 04/15/21   Trula Slade, DPM      Allergies    Patient has no known allergies.    Review of Systems   Review of Systems  Physical Exam Updated Vital Signs BP (!) 155/99   Pulse 81   Temp 98.6 F (37 C) (Oral)   Resp 20   Ht 1.702 m ('5\' 7"'$ )   Wt 56.2 kg   SpO2 97%   BMI 19.42 kg/m  Physical Exam Vitals and nursing note reviewed.  Constitutional:      General: She is not in acute distress.    Appearance: She is well-developed.  HENT:     Head: Normocephalic and atraumatic.     Right Ear: External ear normal.     Left Ear: External ear normal.  Eyes:     General: No scleral icterus.       Right eye: No discharge.        Left eye: No discharge.     Conjunctiva/sclera: Conjunctivae normal.  Neck:     Trachea: No tracheal deviation.  Cardiovascular:     Rate and Rhythm: Normal rate  and regular rhythm.  Pulmonary:     Effort: Pulmonary effort is normal. No respiratory distress.     Breath sounds: Normal breath sounds. No stridor. No wheezing or rales.  Abdominal:     General: Bowel sounds are normal. There is no distension.     Palpations: Abdomen is soft.     Tenderness: There is no abdominal tenderness. There is no guarding or rebound.  Musculoskeletal:        General: No deformity.     Cervical back: Neck supple. No tenderness.     Thoracic back: Tenderness present. Normal range of motion.  Skin:    General: Skin is warm and dry.     Findings: No rash.  Neurological:     General: No focal deficit present.     Mental Status: She is alert.     GCS: GCS eye subscore is 4. GCS verbal subscore is 4. GCS motor subscore is 6.     Cranial Nerves: No cranial nerve deficit (no facial droop,  extraocular movements intact, no slurred speech) or dysarthria.     Sensory: No sensory deficit.     Motor: Tremor (mild) present. No weakness, abnormal muscle tone or seizure activity.     Coordination: Coordination normal.  Psychiatric:        Mood and Affect: Mood normal.     ED Results / Procedures / Treatments   Labs (all labs ordered are listed, but only abnormal results are displayed) Labs Reviewed  CBC WITH DIFFERENTIAL/PLATELET - Abnormal; Notable for the following components:      Result Value   WBC 11.4 (*)    MCV 100.2 (*)    Neutro Abs 8.6 (*)    Abs Immature Granulocytes 0.08 (*)    All other components within normal limits  ETHANOL - Abnormal; Notable for the following components:   Alcohol, Ethyl (B) 34 (*)    All other components within normal limits  URINALYSIS, ROUTINE W REFLEX MICROSCOPIC - Abnormal; Notable for the following components:   Color, Urine STRAW (*)    APPearance HAZY (*)    Hgb urine dipstick SMALL (*)    Ketones, ur 5 (*)    Leukocytes,Ua TRACE (*)    Bacteria, UA RARE (*)    All other components within normal limits  RAPID URINE DRUG SCREEN, HOSP PERFORMED - Abnormal; Notable for the following components:   Benzodiazepines POSITIVE (*)    All other components within normal limits  COMPREHENSIVE METABOLIC PANEL  CBG MONITORING, ED    EKG None  Radiology CT Head Wo Contrast  Result Date: 04/01/2022 CLINICAL DATA:  Fall EXAM: CT HEAD WITHOUT CONTRAST CT CERVICAL SPINE WITHOUT CONTRAST TECHNIQUE: Multidetector CT imaging of the head and cervical spine was performed following the standard protocol without intravenous contrast. Multiplanar CT image reconstructions of the cervical spine were also generated. RADIATION DOSE REDUCTION: This exam was performed according to the departmental dose-optimization program which includes automated exposure control, adjustment of the mA and/or kV according to patient size and/or use of iterative  reconstruction technique. COMPARISON:  CT head 07/19/2011, cervical spine MRI 05/03/2006 FINDINGS: CT HEAD FINDINGS Brain: There is no acute intracranial hemorrhage, extra-axial fluid collection, or acute infarct. Background parenchymal volume is normal. The ventricles are normal in size. Gray-white differentiation is preserved There is no mass lesion.  There is no mass effect or midline shift. Vascular: No hyperdense vessel or unexpected calcification. Skull: Normal. Negative for fracture or focal lesion. Sinuses/Orbits: There is mucosal  thickening in the paranasal sinuses with layering fluid in the right maxillary sinus. Globes and orbits are unremarkable. Other: None. CT CERVICAL SPINE FINDINGS Alignment: Normal. There is no jumped or perched facet or other evidence of traumatic malalignment. Skull base and vertebrae: Skull base alignment is maintained. Vertebral body heights are preserved. There is no evidence of acute fracture. There is no suspicious osseous lesion. Postsurgical changes reflecting C4 through C7 ACDF are noted with solid osseous fusion across the disc spaces and no evidence of hardware related complication. Soft tissues and spinal canal: No prevertebral fluid or swelling. No visible canal hematoma. Disc levels: There is mild adjacent segment disease with uncovertebral spurring at C3-C4 but no evidence of significant spinal canal stenosis. There is probable moderate bilateral neural foraminal stenosis. Upper chest: The imaged lung apices are clear. Other: None. IMPRESSION: 1. No acute intracranial pathology. 2. No acute fracture or traumatic malalignment of the cervical spine. 3. Layering fluid in the right maxillary sinus which can be seen with acute sinusitis in the correct clinical setting. Electronically Signed   By: Valetta Mole M.D.   On: 04/01/2022 16:07   CT Cervical Spine Wo Contrast  Result Date: 04/01/2022 CLINICAL DATA:  Fall EXAM: CT HEAD WITHOUT CONTRAST CT CERVICAL SPINE  WITHOUT CONTRAST TECHNIQUE: Multidetector CT imaging of the head and cervical spine was performed following the standard protocol without intravenous contrast. Multiplanar CT image reconstructions of the cervical spine were also generated. RADIATION DOSE REDUCTION: This exam was performed according to the departmental dose-optimization program which includes automated exposure control, adjustment of the mA and/or kV according to patient size and/or use of iterative reconstruction technique. COMPARISON:  CT head 07/19/2011, cervical spine MRI 05/03/2006 FINDINGS: CT HEAD FINDINGS Brain: There is no acute intracranial hemorrhage, extra-axial fluid collection, or acute infarct. Background parenchymal volume is normal. The ventricles are normal in size. Gray-white differentiation is preserved There is no mass lesion.  There is no mass effect or midline shift. Vascular: No hyperdense vessel or unexpected calcification. Skull: Normal. Negative for fracture or focal lesion. Sinuses/Orbits: There is mucosal thickening in the paranasal sinuses with layering fluid in the right maxillary sinus. Globes and orbits are unremarkable. Other: None. CT CERVICAL SPINE FINDINGS Alignment: Normal. There is no jumped or perched facet or other evidence of traumatic malalignment. Skull base and vertebrae: Skull base alignment is maintained. Vertebral body heights are preserved. There is no evidence of acute fracture. There is no suspicious osseous lesion. Postsurgical changes reflecting C4 through C7 ACDF are noted with solid osseous fusion across the disc spaces and no evidence of hardware related complication. Soft tissues and spinal canal: No prevertebral fluid or swelling. No visible canal hematoma. Disc levels: There is mild adjacent segment disease with uncovertebral spurring at C3-C4 but no evidence of significant spinal canal stenosis. There is probable moderate bilateral neural foraminal stenosis. Upper chest: The imaged lung  apices are clear. Other: None. IMPRESSION: 1. No acute intracranial pathology. 2. No acute fracture or traumatic malalignment of the cervical spine. 3. Layering fluid in the right maxillary sinus which can be seen with acute sinusitis in the correct clinical setting. Electronically Signed   By: Valetta Mole M.D.   On: 04/01/2022 16:07   DG Thoracic Spine 2 View  Result Date: 04/01/2022 CLINICAL DATA:  Fall in kitchen this morning with mid back pain. EXAM: THORACIC SPINE 2 VIEWS COMPARISON:  Chest x-ray 03/21/2018 FINDINGS: Vertebral body alignment is normal. There is a mild anterior wedge compression deformity  of T8, age indeterminate, but new since the previous exam. No evidence of subluxation. Minimal spondylosis of the thoracic spine. Anterior fusion hardware anterior fusion hardware over the cervical spine from C4-C7. IMPRESSION: 1. Mild anterior wedge compression deformity of T8 age indeterminate, but new since the previous exam. 2. Mild spondylosis of the thoracic spine. No subluxation. 3. Anterior fusion hardware intact from C4-C7. Electronically Signed   By: Marin Olp M.D.   On: 04/01/2022 15:36    Procedures Procedures    Medications Ordered in ED Medications - No data to display  ED Course/ Medical Decision Making/ A&P Clinical Course as of 04/01/22 1729  Fri Apr 01, 2022  1701 Ethanol(!) Ethanol level slightly elevated [JK]  1701 Comprehensive metabolic panel Normal [JK]  1702 CBC with Differential(!) White blood cell count slightly elevated [JK]  1702 Urinalysis, Routine w reflex microscopic(!) Not suggestive of infection [JK]  1702 Rapid urine drug screen (hospital performed)(!) UDS positive for benzodiazepines [JK]  1702 Head CT without acute traumatic injury.  The patient's clinical presentation not consistent with sinusitis [JK]  1703 Mild anterior wedge deformity noted at T8 [JK]    Clinical Course User Index [JK] Dorie Rank, MD                           Medical  Decision Making Problems Addressed: Compression fracture of T8 vertebra, initial encounter [facility]): undiagnosed new problem with uncertain prognosis Fall, initial encounter: acute illness or injury  Amount and/or Complexity of Data Reviewed Labs: ordered. Decision-making details documented in ED Course. Radiology: ordered and independent interpretation performed.   Patient presented to the ED for evaluation after a fall.  Initially there is some concern about the patient's confusion but after speaking with the husband this is not a new issue.  Did note that the patient had slightly elevated alcohol level and she does take benzodiazepines.  She denies any alcohol use today but does admit to alcohol use last evening.  Did caution the patient on combining alcohol and her benzodiazepines.  It is possible that she may have underlying alcohol abuse disorder as well considering the elevated ethanol level this afternoon.  Patient however other highs appear stable.  She is can go home with her husband.  X-ray does show possibility of T8 compression fracture but otherwise no other serious injuries.  Patient is tender in that area but this also may be occult.  We will have her take over-the-counter medications.  Outpatient follow-up with orthopedics.  Patient is able to sit up walk without difficulty here in the ED        Final Clinical Impression(s) / ED Diagnoses Final diagnoses:  Fall, initial encounter  Compression fracture of T8 vertebra, initial encounter [facility])    Rx / DC Orders ED Discharge Orders     None         Dorie Rank, MD 04/01/22 1731

## 2022-04-01 NOTE — Discharge Instructions (Addendum)
Take over the counter medications such as tylenol as needed for pain.  The xray did show a possible compression fx at the 8th thoracic verterbae.  This could be causing your pain in the back.  Follow up with the orthopedic doctor for further evaluation.

## 2022-04-18 DIAGNOSIS — F419 Anxiety disorder, unspecified: Secondary | ICD-10-CM | POA: Diagnosis not present

## 2022-04-18 DIAGNOSIS — E063 Autoimmune thyroiditis: Secondary | ICD-10-CM | POA: Diagnosis not present

## 2022-07-22 ENCOUNTER — Ambulatory Visit: Payer: Medicare PPO | Admitting: Podiatry

## 2022-07-22 VITALS — BP 143/85 | HR 69

## 2022-07-22 DIAGNOSIS — M216X1 Other acquired deformities of right foot: Secondary | ICD-10-CM

## 2022-07-22 DIAGNOSIS — L84 Corns and callosities: Secondary | ICD-10-CM

## 2022-07-22 NOTE — Progress Notes (Signed)
Subjective: Chief Complaint  Patient presents with   Plantar Warts    Right forefoot plantar wart, patient has some pain, rate of pain 2 out of 2    77 year old female presents the office for above concerns.  She gets pain on the right foot submetatarsal 2 on area of the skin lesion.  No recent injuries.  No drainage or pus.  No fevers or chills.  Objective: AAO x3, NAD DP/PT pulses palpable bilaterally, CRT less than 3 seconds Hyperkeratotic lesion right foot submetatarsal 2 without any underlying ulceration drainage or signs of infection.  There is prominent metatarsal head plantarly with atrophy of the fat pad. No pain with calf compression, swelling, warmth, erythema  Assessment: Hyperkeratotic lesion due to prominent metatarsal right side  Plan: -All treatment options discussed with the patient including all alternatives, risks, complications.  -Sharply debrided the lesion with any complications or bleeding.  Discussed moisturizer, offloading.  Consider orthotics to help offload if needed as well. -Patient encouraged to call the office with any questions, concerns, change in symptoms.   Trula Slade DPM

## 2022-08-16 DIAGNOSIS — K4090 Unilateral inguinal hernia, without obstruction or gangrene, not specified as recurrent: Secondary | ICD-10-CM | POA: Diagnosis not present

## 2022-08-16 DIAGNOSIS — Z681 Body mass index (BMI) 19 or less, adult: Secondary | ICD-10-CM | POA: Diagnosis not present

## 2022-09-05 DIAGNOSIS — H6693 Otitis media, unspecified, bilateral: Secondary | ICD-10-CM | POA: Diagnosis not present

## 2022-09-05 DIAGNOSIS — J0100 Acute maxillary sinusitis, unspecified: Secondary | ICD-10-CM | POA: Diagnosis not present

## 2022-09-05 DIAGNOSIS — R030 Elevated blood-pressure reading, without diagnosis of hypertension: Secondary | ICD-10-CM | POA: Diagnosis not present

## 2022-09-12 ENCOUNTER — Ambulatory Visit: Payer: Self-pay | Admitting: General Surgery

## 2022-09-12 DIAGNOSIS — K4090 Unilateral inguinal hernia, without obstruction or gangrene, not specified as recurrent: Secondary | ICD-10-CM | POA: Diagnosis not present

## 2022-09-12 NOTE — H&P (View-Only) (Signed)
Chief Complaint: New Consultation (Left Inguinal Hernia - c/o pain off and on)     History of Present Illness: Ann Fowler is a 77 y.o. female who is seen today as an office consultation at the request of Dr. Hilma Favors for evaluation of New Consultation (Left Inguinal Hernia - c/o pain off and on) .    Patient is a 77 year old female with history of hypothyroidism, comes in with a 4 to 5-year history of a left inguinal hernia.  Patient the hernias gotten larger over the period of time.  She does have on and off pain at sporadic.  She states that she has no issues with lifting coughing or sneezing.  She has had previous hernia repair in the past she is unsure which side.    Review of Systems: A complete review of systems was obtained from the patient.  I have reviewed this information and discussed as appropriate with the patient.  See HPI as well for other ROS.  Review of Systems  Constitutional:  Negative for fever.  HENT:  Negative for congestion.   Eyes:  Negative for blurred vision.  Respiratory:  Negative for cough, shortness of breath and wheezing.   Cardiovascular:  Negative for chest pain and palpitations.  Gastrointestinal:  Negative for heartburn.  Genitourinary:  Negative for dysuria.  Musculoskeletal:  Negative for myalgias.  Skin:  Negative for rash.  Neurological:  Negative for dizziness and headaches.  Psychiatric/Behavioral:  Negative for depression and suicidal ideas.   All other systems reviewed and are negative.     Medical History: Past Medical History: Diagnosis Date  Arthritis   Asthma, unspecified asthma severity, unspecified whether complicated, unspecified whether persistent   Thyroid disease    There is no problem list on file for this patient.   History reviewed. No pertinent surgical history.   No Known Allergies  Current Outpatient Medications on File Prior to Visit Medication Sig Dispense Refill  levothyroxine (SYNTHROID) 50 MCG  tablet     No current facility-administered medications on file prior to visit.   History reviewed. No pertinent family history.   Social History  Tobacco Use Smoking Status Never Smokeless Tobacco Never    Social History  Socioeconomic History  Marital status: Married Tobacco Use  Smoking status: Never  Smokeless tobacco: Never Substance and Sexual Activity  Alcohol use: Not Currently  Drug use: Never   Objective:   Vitals:  09/12/22 1041 09/12/22 1044 BP: (!) 149/90  Pulse: 89  Temp: 36.3 C (97.3 F)  SpO2: 99%  Weight: 56.7 kg (125 lb)  Height: 165.1 cm ('5\' 5"'$ )  PainSc:    2   Body mass index is 20.8 kg/m. Physical Exam Constitutional:      Appearance: Normal appearance.  HENT:     Head: Normocephalic and atraumatic.     Mouth/Throat:     Mouth: Mucous membranes are moist.     Pharynx: Oropharynx is clear.  Eyes:     General: No scleral icterus.    Pupils: Pupils are equal, round, and reactive to light.  Cardiovascular:     Rate and Rhythm: Normal rate and regular rhythm.     Pulses: Normal pulses.     Heart sounds: No murmur heard.    No friction rub. No gallop.  Pulmonary:     Effort: Pulmonary effort is normal. No respiratory distress.     Breath sounds: Normal breath sounds. No stridor.  Abdominal:     General: Abdomen is flat.  Hernia: A hernia is present. Hernia is present in the left inguinal area.  Musculoskeletal:        General: No swelling.  Skin:    General: Skin is warm.  Neurological:     General: No focal deficit present.     Mental Status: She is alert and oriented to person, place, and time. Mental status is at baseline.  Psychiatric:        Mood and Affect: Mood normal.        Thought Content: Thought content normal.        Judgment: Judgment normal.       Assessment and Plan: Diagnoses and all orders for this visit:  Unilateral inguinal hernia without obstruction or gangrene, recurrence not specified    Ann Fowler is a 77 y.o. female   1.  We will proceed to the OR for a laparoscopic left inguinal hernia repair with mesh. 2. All risks and benefits were discussed with the patient, to generally include infection, bleeding, damage to surrounding structures, acute and chronic nerve pain, and recurrence. Alternatives were offered and described.  All questions were answered and the patient voiced understanding of the procedure and wishes to proceed at this point.       No follow-ups on file.  Ralene Ok, MD, [facility], [place] [facility]

## 2022-09-12 NOTE — H&P (Signed)
Chief Complaint: New Consultation (Left Inguinal Hernia - c/o pain off and on)     History of Present Illness: Ann Fowler is a 77 y.o. female who is seen today as an office consultation at the request of Dr. Hilma Favors for evaluation of New Consultation (Left Inguinal Hernia - c/o pain off and on) .    Patient is a 77 year old female with history of hypothyroidism, comes in with a 4 to 5-year history of a left inguinal hernia.  Patient the hernias gotten larger over the period of time.  She does have on and off pain at sporadic.  She states that she has no issues with lifting coughing or sneezing.  She has had previous hernia repair in the past she is unsure which side.    Review of Systems: A complete review of systems was obtained from the patient.  I have reviewed this information and discussed as appropriate with the patient.  See HPI as well for other ROS.  Review of Systems  Constitutional:  Negative for fever.  HENT:  Negative for congestion.   Eyes:  Negative for blurred vision.  Respiratory:  Negative for cough, shortness of breath and wheezing.   Cardiovascular:  Negative for chest pain and palpitations.  Gastrointestinal:  Negative for heartburn.  Genitourinary:  Negative for dysuria.  Musculoskeletal:  Negative for myalgias.  Skin:  Negative for rash.  Neurological:  Negative for dizziness and headaches.  Psychiatric/Behavioral:  Negative for depression and suicidal ideas.   All other systems reviewed and are negative.     Medical History: Past Medical History: Diagnosis Date  Arthritis   Asthma, unspecified asthma severity, unspecified whether complicated, unspecified whether persistent   Thyroid disease    There is no problem list on file for this patient.   History reviewed. No pertinent surgical history.   No Known Allergies  Current Outpatient Medications on File Prior to Visit Medication Sig Dispense Refill  levothyroxine (SYNTHROID) 50 MCG  tablet     No current facility-administered medications on file prior to visit.   History reviewed. No pertinent family history.   Social History  Tobacco Use Smoking Status Never Smokeless Tobacco Never    Social History  Socioeconomic History  Marital status: Married Tobacco Use  Smoking status: Never  Smokeless tobacco: Never Substance and Sexual Activity  Alcohol use: Not Currently  Drug use: Never   Objective:   Vitals:  09/12/22 1041 09/12/22 1044 BP: (!) 149/90  Pulse: 89  Temp: 36.3 C (97.3 F)  SpO2: 99%  Weight: 56.7 kg (125 lb)  Height: 165.1 cm ('5\' 5"'$ )  PainSc:    2   Body mass index is 20.8 kg/m. Physical Exam Constitutional:      Appearance: Normal appearance.  HENT:     Head: Normocephalic and atraumatic.     Mouth/Throat:     Mouth: Mucous membranes are moist.     Pharynx: Oropharynx is clear.  Eyes:     General: No scleral icterus.    Pupils: Pupils are equal, round, and reactive to light.  Cardiovascular:     Rate and Rhythm: Normal rate and regular rhythm.     Pulses: Normal pulses.     Heart sounds: No murmur heard.    No friction rub. No gallop.  Pulmonary:     Effort: Pulmonary effort is normal. No respiratory distress.     Breath sounds: Normal breath sounds. No stridor.  Abdominal:     General: Abdomen is flat.  Hernia: A hernia is present. Hernia is present in the left inguinal area.  Musculoskeletal:        General: No swelling.  Skin:    General: Skin is warm.  Neurological:     General: No focal deficit present.     Mental Status: She is alert and oriented to person, place, and time. Mental status is at baseline.  Psychiatric:        Mood and Affect: Mood normal.        Thought Content: Thought content normal.        Judgment: Judgment normal.       Assessment and Plan: Diagnoses and all orders for this visit:  Unilateral inguinal hernia without obstruction or gangrene, recurrence not specified    Ann Fowler is a 77 y.o. female   1.  We will proceed to the OR for a laparoscopic left inguinal hernia repair with mesh. 2. All risks and benefits were discussed with the patient, to generally include infection, bleeding, damage to surrounding structures, acute and chronic nerve pain, and recurrence. Alternatives were offered and described.  All questions were answered and the patient voiced understanding of the procedure and wishes to proceed at this point.       No follow-ups on file.  Ralene Ok, MD, [facility], [place] [facility]

## 2022-09-13 DIAGNOSIS — Z681 Body mass index (BMI) 19 or less, adult: Secondary | ICD-10-CM | POA: Diagnosis not present

## 2022-09-13 DIAGNOSIS — J069 Acute upper respiratory infection, unspecified: Secondary | ICD-10-CM | POA: Diagnosis not present

## 2022-09-16 ENCOUNTER — Encounter (HOSPITAL_COMMUNITY): Payer: Self-pay

## 2022-09-16 NOTE — Progress Notes (Signed)
COVID Vaccine Completed:  Yes  Date of COVID positive in last 90 days:  PCP - Willy Eddy Cardiologist -  Pulmonologist - Baltazar Apo, MD  Chest x-ray -  EKG -  Stress Test -  ECHO - 01-23-15 [facility] Cardiac Cath -  Pacemaker/ICD device last checked: Spinal Cord Stimulator:  Bowel Prep -   Sleep Study -  CPAP -   Fasting Blood Sugar -  Checks Blood Sugar _____ times a day  Last dose of GLP1 agonist-  N/A GLP1 instructions:  N/A   Last dose of SGLT-2 inhibitors-  N/A SGLT-2 instructions: N/A   Blood Thinner Instructions: Aspirin Instructions: Last Dose:  Activity level:  Can go up a flight of stairs and perform activities of daily living without stopping and without symptoms of chest pain or shortness of breath.  Able to exercise without symptoms  Unable to go up a flight of stairs without symptoms of     Anesthesia review:  COPD  Patient denies shortness of breath, fever, cough and chest pain at PAT appointment  Patient verbalized understanding of instructions that were given to them at the PAT appointment. Patient was also instructed that they will need to review over the PAT instructions again at home before surgery.

## 2022-09-16 NOTE — Patient Instructions (Signed)
SURGICAL WAITING ROOM VISITATION Patients having surgery or a procedure may have no more than 2 support people in the waiting area - these visitors may rotate.    If the patient needs to stay at the hospital during part of their recovery, the visitor guidelines for inpatient rooms apply. Pre-op nurse will coordinate an appropriate time for 1 support person to accompany patient in pre-op.  This support person may not rotate.    Please refer to the [facility] website for the visitor guidelines for Inpatients (after your surgery is over and you are in a regular room).   Due to an increase in RSV and influenza rates and associated hospitalizations, children ages 40 and under may not visit patients in [place].     Your procedure is scheduled on: 09-20-22   Report to [facility] Main Entrance    Report to admitting at 8:00 AM   Call this number if you have problems the morning of surgery [phone]   Do not eat food :After Midnight.   After Midnight you may have the following liquids until 7:15 AM DAY OF SURGERY  Water Non-Citrus Juices (without pulp, NO RED) Carbonated Beverages Black Coffee (NO MILK/CREAM OR CREAMERS, sugar ok)  Clear Tea (NO MILK/CREAM OR CREAMERS, sugar ok) regular and decaf                             Plain Jell-O (NO RED)                                           Fruit ices (not with fruit pulp, NO RED)                                     Popsicles (NO RED)                                                               Sports drinks like Gatorade (NO RED)                   The day of surgery:  Drink ONE (1) Pre-Surgery Clear Ensure at 7:15 AM the morning of surgery. Drink in one sitting. Do not sip.  This drink was given to you during your hospital  pre-op appointment visit. Nothing else to drink after completing the Pre-Surgery Clear Ensure.          If you have questions, please contact your surgeon's office.   FOLLOW  ANY  ADDITIONAL PRE OP INSTRUCTIONS YOU RECEIVED FROM YOUR SURGEON'S OFFICE!!!     Oral Hygiene is also important to reduce your risk of infection.                                    Remember - BRUSH YOUR TEETH THE MORNING OF SURGERY WITH YOUR REGULAR TOOTHPASTE   Do NOT smoke after Midnight   Take these medicines the morning of surgery with A SIP OF WATER:  Alprazolam  Levothyroxine                              You may not have any metal on your body including hair pins, jewelry, and body piercing             Do not wear make-up, lotions, powders, perfumes or deodorant  Do not wear nail polish including gel and S&S, artificial/acrylic nails, or any other type of covering on natural nails including finger and toenails. If you have artificial nails, gel coating, etc. that needs to be removed by a nail salon please have this removed prior to surgery or surgery may need to be canceled/ delayed if the surgeon/ anesthesia feels like they are unable to be safely monitored.   Do not shave  48 hours prior to surgery.    Do not bring valuables to the hospital. [place].   Contacts, dentures or bridgework may not be worn into surgery.  DO NOT [place]. PHARMACY WILL DISPENSE MEDICATIONS LISTED ON YOUR MEDICATION LIST TO YOU DURING YOUR ADMISSION [place]!    Patients discharged on the day of surgery will not be allowed to drive home.  Someone NEEDS to stay with you for the first 24 hours after anesthesia.   Special Instructions: Bring a copy of your healthcare power of attorney and living will documents the day of surgery if you haven't scanned them before.              Please read over the following fact sheets you were given: IF [place]  If you received a COVID test during your pre-op visit  it is requested that you wear a mask when out in  public, stay away from anyone that may not be feeling well and notify your surgeon if you develop symptoms. If you test positive for Covid or have been in contact with anyone that has tested positive in the last 10 days please notify you surgeon.  [place] - Preparing for Surgery Before surgery, you can play an important role.  Because skin is not sterile, your skin needs to be as free of germs as possible.  You can reduce the number of germs on your skin by washing with CHG (chlorahexidine gluconate) soap before surgery.  CHG is an antiseptic cleaner which kills germs and bonds with the skin to continue killing germs even after washing. Please DO NOT use if you have an allergy to CHG or antibacterial soaps.  If your skin becomes reddened/irritated stop using the CHG and inform your nurse when you arrive at Short Stay. Do not shave (including legs and underarms) for at least 48 hours prior to the first CHG shower.  You may shave your face/neck.  Please follow these instructions carefully:  1.  Shower with CHG Soap the night before surgery and the  morning of surgery.  2.  If you choose to wash your hair, wash your hair first as usual with your normal  shampoo.  3.  After you shampoo, rinse your hair and body thoroughly to remove the shampoo.                             4.  Use CHG as you would any other liquid soap.  You can apply chg directly to the skin and wash.  Gently with a scrungie or clean washcloth.  5.  Apply the CHG Soap to your body ONLY FROM THE NECK DOWN.   Do   not use on face/ open                           Wound or open sores. Avoid contact with eyes, ears mouth and   genitals (private parts).                       Wash face,  Genitals (private parts) with your normal soap.             6.  Wash thoroughly, paying special attention to the area where your    surgery  will be performed.  7.  Thoroughly rinse your body with warm water from the neck down.  8.  DO NOT shower/wash with  your normal soap after using and rinsing off the CHG Soap.                9.  Pat yourself dry with a clean towel.            10.  Wear clean pajamas.            11.  Place clean sheets on your bed the night of your first shower and do not  sleep with pets. Day of Surgery : Do not apply any lotions/deodorants the morning of surgery.  Please wear clean clothes to the hospital/surgery center.  FAILURE TO FOLLOW THESE INSTRUCTIONS MAY RESULT IN THE CANCELLATION OF YOUR SURGERY  PATIENT SIGNATURE_________________________________  NURSE SIGNATURE__________________________________  ________________________________________________________________________

## 2022-09-19 ENCOUNTER — Encounter (HOSPITAL_COMMUNITY)
Admission: RE | Admit: 2022-09-19 | Discharge: 2022-09-19 | Disposition: A | Discharge status: Home / Self Care | Payer: Medicare PPO | Source: Ambulatory Visit | Attending: General Surgery | Admitting: General Surgery

## 2022-09-19 ENCOUNTER — Other Ambulatory Visit: Payer: Self-pay

## 2022-09-19 ENCOUNTER — Encounter (HOSPITAL_COMMUNITY): Payer: Self-pay

## 2022-09-19 VITALS — BP 159/92 | HR 76 | Temp 98.5°F | Resp 12 | Ht 67.0 in | Wt 123.6 lb

## 2022-09-19 DIAGNOSIS — Z01818 Encounter for other preprocedural examination: Secondary | ICD-10-CM | POA: Diagnosis not present

## 2022-09-19 HISTORY — DX: Hypothyroidism, unspecified: E03.9

## 2022-09-19 HISTORY — DX: Unspecified asthma, uncomplicated: J45.909

## 2022-09-19 LAB — CBC
HCT: 43.2 % (ref 36.0–46.0)
Hemoglobin: 14.3 g/dL (ref 12.0–15.0)
MCH: 32.9 pg (ref 26.0–34.0)
MCHC: 33.1 g/dL (ref 30.0–36.0)
MCV: 99.3 fL (ref 80.0–100.0)
Platelets: 377 10*3/uL (ref 150–400)
RBC: 4.35 MIL/uL (ref 3.87–5.11)
RDW: 14 % (ref 11.5–15.5)
WBC: 8.5 10*3/uL (ref 4.0–10.5)
nRBC: 0 % (ref 0.0–0.2)

## 2022-09-19 NOTE — Progress Notes (Signed)
Attempted to call pharmacy x2 during patient's preop appointment with no answer.  Patient given phone number to call pharmacy.

## 2022-09-20 ENCOUNTER — Ambulatory Visit (HOSPITAL_BASED_OUTPATIENT_CLINIC_OR_DEPARTMENT_OTHER): Payer: Medicare PPO | Admitting: Anesthesiology

## 2022-09-20 ENCOUNTER — Other Ambulatory Visit: Payer: Self-pay

## 2022-09-20 ENCOUNTER — Encounter (HOSPITAL_COMMUNITY): Payer: Self-pay | Admitting: General Surgery

## 2022-09-20 ENCOUNTER — Ambulatory Visit (HOSPITAL_COMMUNITY): Payer: Medicare PPO | Admitting: Anesthesiology

## 2022-09-20 ENCOUNTER — Ambulatory Visit (HOSPITAL_COMMUNITY)
Admission: RE | Admit: 2022-09-20 | Discharge: 2022-09-20 | Disposition: A | Discharge status: Home / Self Care | Payer: Medicare PPO | Attending: General Surgery | Admitting: General Surgery

## 2022-09-20 ENCOUNTER — Encounter (HOSPITAL_COMMUNITY)
Admission: RE | Disposition: A | Discharge status: Home / Self Care | Payer: Self-pay | Source: Home / Self Care | Attending: General Surgery

## 2022-09-20 DIAGNOSIS — J45909 Unspecified asthma, uncomplicated: Secondary | ICD-10-CM | POA: Diagnosis not present

## 2022-09-20 DIAGNOSIS — K4090 Unilateral inguinal hernia, without obstruction or gangrene, not specified as recurrent: Secondary | ICD-10-CM

## 2022-09-20 DIAGNOSIS — Z01818 Encounter for other preprocedural examination: Secondary | ICD-10-CM

## 2022-09-20 DIAGNOSIS — F419 Anxiety disorder, unspecified: Secondary | ICD-10-CM | POA: Diagnosis not present

## 2022-09-20 DIAGNOSIS — E039 Hypothyroidism, unspecified: Secondary | ICD-10-CM | POA: Diagnosis not present

## 2022-09-20 HISTORY — PX: INGUINAL HERNIA REPAIR: SHX194

## 2022-09-20 SURGERY — REPAIR, HERNIA, INGUINAL, LAPAROSCOPIC
Anesthesia: General | Laterality: Left

## 2022-09-20 MED ORDER — DEXAMETHASONE SODIUM PHOSPHATE 10 MG/ML IJ SOLN
INTRAMUSCULAR | Status: DC | PRN
  Administered 2022-09-20: 4 mg via INTRAVENOUS

## 2022-09-20 MED ORDER — PROPOFOL 500 MG/50ML IV EMUL
INTRAVENOUS | Status: DC | PRN
  Administered 2022-09-20: 150 ug/kg/min via INTRAVENOUS

## 2022-09-20 MED ORDER — ONDANSETRON HCL 4 MG/2ML IJ SOLN
INTRAMUSCULAR | Status: AC
  Filled 2022-09-20: qty 2

## 2022-09-20 MED ORDER — ROCURONIUM BROMIDE 10 MG/ML (PF) SYRINGE
PREFILLED_SYRINGE | INTRAVENOUS | Status: DC | PRN
  Administered 2022-09-20: 50 mg via INTRAVENOUS

## 2022-09-20 MED ORDER — BUPIVACAINE-EPINEPHRINE 0.25% -1:200000 IJ SOLN
INTRAMUSCULAR | Status: DC | PRN
  Administered 2022-09-20: 10 mL

## 2022-09-20 MED ORDER — DEXAMETHASONE SODIUM PHOSPHATE 10 MG/ML IJ SOLN
INTRAMUSCULAR | Status: AC
  Filled 2022-09-20: qty 1

## 2022-09-20 MED ORDER — ROCURONIUM BROMIDE 10 MG/ML (PF) SYRINGE
PREFILLED_SYRINGE | INTRAVENOUS | Status: AC
  Filled 2022-09-20: qty 10

## 2022-09-20 MED ORDER — PROPOFOL 10 MG/ML IV BOLUS
INTRAVENOUS | Status: AC
  Filled 2022-09-20: qty 20

## 2022-09-20 MED ORDER — CHLORHEXIDINE GLUCONATE 0.12 % MT SOLN
15.0000 mL | Freq: Once | OROMUCOSAL | Status: AC
  Administered 2022-09-20: 15 mL via OROMUCOSAL

## 2022-09-20 MED ORDER — ENSURE PRE-SURGERY PO LIQD
296.0000 mL | Freq: Once | ORAL | Status: DC
  Filled 2022-09-20: qty 296

## 2022-09-20 MED ORDER — ALBUTEROL SULFATE HFA 108 (90 BASE) MCG/ACT IN AERS
INHALATION_SPRAY | RESPIRATORY_TRACT | Status: AC
  Filled 2022-09-20: qty 6.7

## 2022-09-20 MED ORDER — ALBUTEROL SULFATE HFA 108 (90 BASE) MCG/ACT IN AERS
INHALATION_SPRAY | RESPIRATORY_TRACT | Status: DC | PRN
  Administered 2022-09-20: 2 via RESPIRATORY_TRACT

## 2022-09-20 MED ORDER — PROPOFOL 10 MG/ML IV BOLUS
INTRAVENOUS | Status: DC | PRN
  Administered 2022-09-20: 100 mg via INTRAVENOUS

## 2022-09-20 MED ORDER — LIDOCAINE 2% (20 MG/ML) 5 ML SYRINGE
INTRAMUSCULAR | Status: DC | PRN
  Administered 2022-09-20: 100 mg via INTRAVENOUS

## 2022-09-20 MED ORDER — BUPIVACAINE-EPINEPHRINE (PF) 0.25% -1:200000 IJ SOLN
INTRAMUSCULAR | Status: AC
  Filled 2022-09-20: qty 30

## 2022-09-20 MED ORDER — FENTANYL CITRATE (PF) 100 MCG/2ML IJ SOLN
INTRAMUSCULAR | Status: DC | PRN
  Administered 2022-09-20 (×4): 50 ug via INTRAVENOUS

## 2022-09-20 MED ORDER — FENTANYL CITRATE (PF) 100 MCG/2ML IJ SOLN
INTRAMUSCULAR | Status: AC
  Filled 2022-09-20: qty 2

## 2022-09-20 MED ORDER — ACETAMINOPHEN 160 MG/5ML PO SOLN: 325.0000 mg | ORAL | Status: DC | PRN

## 2022-09-20 MED ORDER — OXYCODONE HCL 5 MG/5ML PO SOLN: 5.0000 mg | Freq: Once | ORAL | Status: DC | PRN

## 2022-09-20 MED ORDER — LACTATED RINGERS IV SOLN: INTRAVENOUS | Status: DC

## 2022-09-20 MED ORDER — ORAL CARE MOUTH RINSE: 15.0000 mL | Freq: Once | OROMUCOSAL | Status: AC

## 2022-09-20 MED ORDER — TRAMADOL HCL 50 MG PO TABS: 50.0000 mg | ORAL_TABLET | Freq: Four times a day (QID) | ORAL | 0 refills | Status: AC | PRN

## 2022-09-20 MED ORDER — OXYCODONE HCL 5 MG PO TABS: 5.0000 mg | ORAL_TABLET | Freq: Once | ORAL | Status: DC | PRN

## 2022-09-20 MED ORDER — ONDANSETRON HCL 4 MG/2ML IJ SOLN: 4.0000 mg | Freq: Once | INTRAMUSCULAR | Status: DC | PRN

## 2022-09-20 MED ORDER — MEPERIDINE HCL 50 MG/ML IJ SOLN: 6.2500 mg | INTRAMUSCULAR | Status: DC | PRN

## 2022-09-20 MED ORDER — SUGAMMADEX SODIUM 200 MG/2ML IV SOLN
INTRAVENOUS | Status: DC | PRN
  Administered 2022-09-20: 120 mg via INTRAVENOUS

## 2022-09-20 MED ORDER — ONDANSETRON HCL 4 MG/2ML IJ SOLN
INTRAMUSCULAR | Status: DC | PRN
  Administered 2022-09-20: 4 mg via INTRAVENOUS

## 2022-09-20 MED ORDER — KETOROLAC TROMETHAMINE 30 MG/ML IJ SOLN
INTRAMUSCULAR | Status: DC | PRN
  Administered 2022-09-20: 15 mg via INTRAVENOUS

## 2022-09-20 MED ORDER — FENTANYL CITRATE PF 50 MCG/ML IJ SOSY: 25.0000 ug | PREFILLED_SYRINGE | INTRAMUSCULAR | Status: DC | PRN

## 2022-09-20 MED ORDER — CHLORHEXIDINE GLUCONATE CLOTH 2 % EX PADS: 6.0000 | MEDICATED_PAD | Freq: Once | CUTANEOUS | Status: DC

## 2022-09-20 MED ORDER — 0.9 % SODIUM CHLORIDE (POUR BTL) OPTIME
TOPICAL | Status: DC | PRN
  Administered 2022-09-20: 1000 mL

## 2022-09-20 MED ORDER — ACETAMINOPHEN 500 MG PO TABS
1000.0000 mg | ORAL_TABLET | ORAL | Status: DC
  Filled 2022-09-20: qty 2

## 2022-09-20 MED ORDER — CEFAZOLIN SODIUM-DEXTROSE 2-4 GM/100ML-% IV SOLN
2.0000 g | INTRAVENOUS | Status: AC
  Administered 2022-09-20: 2 g via INTRAVENOUS
  Filled 2022-09-20: qty 100

## 2022-09-20 MED ORDER — ACETAMINOPHEN 325 MG PO TABS: 325.0000 mg | ORAL_TABLET | ORAL | Status: DC | PRN

## 2022-09-20 SURGICAL SUPPLY — 50 items
ADH SKN CLS APL DERMABOND .7 (GAUZE/BANDAGES/DRESSINGS) ×1
APL PRP STRL LF DISP 70% ISPRP (MISCELLANEOUS) ×1
APPLIER CLIP 5 13 M/L LIGAMAX5 (MISCELLANEOUS)
APR CLP MED LRG 5 ANG JAW (MISCELLANEOUS)
CABLE HIGH FREQUENCY MONO STRZ (ELECTRODE) ×2 IMPLANT
CATH FOLEY 3WAY  5CC 16FR (CATHETERS)
CATH FOLEY 3WAY 5CC 16FR (CATHETERS) ×2 IMPLANT
CHLORAPREP W/TINT 26 (MISCELLANEOUS) ×2 IMPLANT
CLIP APPLIE 5 13 M/L LIGAMAX5 (MISCELLANEOUS) IMPLANT
DERMABOND ADVANCED .7 DNX12 (GAUZE/BANDAGES/DRESSINGS) ×2 IMPLANT
DISSECTOR BLUNT TIP ENDO 5MM (MISCELLANEOUS) IMPLANT
ELECT REM PT RETURN 15FT ADLT (MISCELLANEOUS) ×2 IMPLANT
ENDOLOOP SUT PDS II  0 18 (SUTURE)
ENDOLOOP SUT PDS II 0 18 (SUTURE) IMPLANT
GLOVE BIO SURGEON STRL SZ7.5 (GLOVE) ×2 IMPLANT
GOWN STRL REUS W/ TWL XL LVL3 (GOWN DISPOSABLE) ×4 IMPLANT
GOWN STRL REUS W/TWL XL LVL3 (GOWN DISPOSABLE) ×2
IRRIG SUCT STRYKERFLOW 2 WTIP (MISCELLANEOUS)
IRRIGATION SUCT STRKRFLW 2 WTP (MISCELLANEOUS) IMPLANT
KIT BASIN OR (CUSTOM PROCEDURE TRAY) ×2 IMPLANT
KIT TURNOVER KIT A (KITS) IMPLANT
MARKER SKIN DUAL TIP RULER LAB (MISCELLANEOUS) ×2 IMPLANT
MESH 3DMAX 4X6 LT LRG (Mesh General) IMPLANT
NDL INSUFFLATION 14GA 120MM (NEEDLE) IMPLANT
NEEDLE INSUFFLATION 14GA 120MM (NEEDLE) IMPLANT
PLUG CATH AND CAP STER (CATHETERS) ×2 IMPLANT
POUCH LAPAROSCOPIC INSTRUMENT (MISCELLANEOUS) ×4 IMPLANT
PROTECTOR NERVE ULNAR (MISCELLANEOUS) IMPLANT
RELOAD STAPLE 4.0 BLU F/HERNIA (INSTRUMENTS) ×2 IMPLANT
RELOAD STAPLE 4.8 BLK F/HERNIA (STAPLE) IMPLANT
RELOAD STAPLE HERNIA 4.0 BLUE (INSTRUMENTS) ×1 IMPLANT
RELOAD STAPLE HERNIA 4.8 BLK (STAPLE) IMPLANT
SCISSORS LAP 5X35 DISP (ENDOMECHANICALS) IMPLANT
SET IRRIG Y TYPE TUR BLADDER L (SET/KITS/TRAYS/PACK) ×2 IMPLANT
SET TUBE SMOKE EVAC HIGH FLOW (TUBING) ×2 IMPLANT
SPIKE FLUID TRANSFER (MISCELLANEOUS) ×2 IMPLANT
STAPLER HERNIA 12 8.5 360D (INSTRUMENTS) ×2 IMPLANT
SUT MNCRL AB 4-0 PS2 18 (SUTURE) ×2 IMPLANT
SUT VIC AB 1 CT1 27 (SUTURE)
SUT VIC AB 1 CT1 27XBRD ANBCTR (SUTURE) IMPLANT
SYR TOOMEY IRRIG 70ML (MISCELLANEOUS)
SYRINGE TOOMEY IRRIG 70ML (MISCELLANEOUS) ×2 IMPLANT
TOWEL OR 17X26 10 PK STRL BLUE (TOWEL DISPOSABLE) ×2 IMPLANT
TOWEL OR NON WOVEN STRL DISP B (DISPOSABLE) ×2 IMPLANT
TRAY FOLEY MTR SLVR 14FR STAT (SET/KITS/TRAYS/PACK) ×2 IMPLANT
TRAY LAPAROSCOPIC (CUSTOM PROCEDURE TRAY) ×2 IMPLANT
TROCAR ADV FIXATION 12X100MM (TROCAR) ×2 IMPLANT
TROCAR OPTICAL SHORT 5MM (TROCAR) IMPLANT
TROCAR OPTICAL SLV SHORT 5MM (TROCAR) ×2 IMPLANT
TROCAR Z THREAD OPTICAL 12X100 (TROCAR) IMPLANT

## 2022-09-20 NOTE — Anesthesia Postprocedure Evaluation (Signed)
Anesthesia Post Note  Patient: Ann Fowler  Procedure(s) Performed: LAPAROSCOPIC LEFT INGUINAL HERNIA REPAIR WITH MESH (Left)     Patient location during evaluation: PACU Anesthesia Type: General Level of consciousness: awake and alert Pain management: pain level controlled Vital Signs Assessment: post-procedure vital signs reviewed and stable Respiratory status: spontaneous breathing, nonlabored ventilation, respiratory function stable and patient connected to nasal cannula oxygen Cardiovascular status: blood pressure returned to baseline and stable Postop Assessment: no apparent nausea or vomiting Anesthetic complications: no   No notable events documented.  Last Vitals:  Vitals:   09/20/22 1215 09/20/22 1244  BP: (!) 156/95 (!) 169/106  Pulse: 65 65  Resp: 13 18  Temp:  36.6 C  SpO2: 98% 99%    Last Pain:  Vitals:   09/20/22 1244  TempSrc: Oral  PainSc: 4                  Lane Kjos

## 2022-09-20 NOTE — Discharge Instructions (Signed)
[facility] _______[facility]   INGUINAL HERNIA REPAIR: POST OP INSTRUCTIONS  Always review your discharge instruction sheet given to you by the facility where your surgery was performed. IF YOU HAVE DISABILITY OR FAMILY LEAVE FORMS, YOU MUST BRING THEM TO THE OFFICE FOR PROCESSING.   DO NOT GIVE THEM TO YOUR DOCTOR.  1. A  prescription for pain medication may be given to you upon discharge.  Take your pain medication as prescribed, if needed.  If narcotic pain medicine is not needed, then you may take acetaminophen (Tylenol) or ibuprofen (Advil) as needed. 2. Take your usually prescribed medications unless otherwise directed. If you need a refill on your pain medication, please contact your pharmacy.  They will contact our office to request authorization. Prescriptions will not be filled after 5 pm or on week-ends. 3. You should follow a light diet the first 24 hours after arrival home, such as soup and crackers, etc.  Be sure to include lots of fluids daily.  Resume your normal diet the day after surgery. 4.Most patients will experience some swelling and bruising around the umbilicus or in the groin and scrotum.  Ice packs and reclining will help.  Swelling and bruising can take several days to resolve.  6. It is common to experience some constipation if taking pain medication after surgery.  Increasing fluid intake and taking a stool softener (such as Colace) will usually help or prevent this problem from occurring.  A mild laxative (Milk of Magnesia or Miralax) should be taken according to package directions if there are no bowel movements after 48 hours. 7. Unless discharge instructions indicate otherwise, you may remove your bandages 24-48 hours after surgery, and you may shower at that time.  You may have steri-strips (small skin tapes) in place directly over the incision.  These strips should be left on the skin for 7-10 days.  If your surgeon used skin glue on the incision, you may  shower in 24 hours.  The glue will flake off over the next 2-3 weeks.  Any sutures or staples will be removed at the office during your follow-up visit. 8. ACTIVITIES:  You may resume regular (light) daily activities beginning the next day--such as daily self-care, walking, climbing stairs--gradually increasing activities as tolerated.  You may have sexual intercourse when it is comfortable.  Refrain from any heavy lifting or straining until approved by your doctor.  a.You may drive when you are no longer taking prescription pain medication, you can comfortably wear a seatbelt, and you can safely maneuver your car and apply brakes. b.RETURN TO WORK:   _____________________________________________  9.You should see your doctor in the office for a follow-up appointment approximately 2-3 weeks after your surgery.  Make sure that you call for this appointment within a day or two after you arrive home to insure a convenient appointment time. 10.OTHER INSTRUCTIONS: _________________________    _____________________________________  WHEN TO CALL YOUR DOCTOR: Fever over 101.0 Inability to urinate Nausea and/or vomiting Extreme swelling or bruising Continued bleeding from incision. Increased pain, redness, or drainage from the incision  The clinic staff is available to answer your questions during regular business hours.  Please don't hesitate to call and ask to speak to one of the nurses for clinical concerns.  If you have a medical emergency, go to the nearest emergency room or call 911.  A surgeon from [facility] is always on call at the hospital   [address], [address], [place],   [place]  [address] ?  [address] [address], [place], [place]   [address] [phone] ? [phone] ? FAX [phone] Web site: [email or web address]  

## 2022-09-20 NOTE — Interval H&P Note (Signed)
History and Physical Interval Note:  09/20/2022 8:40 AM  Ann Fowler  has presented today for surgery, with the diagnosis of LEFT INGUINAL HERNIA.  The various methods of treatment have been discussed with the patient and family. After consideration of risks, benefits and other options for treatment, the patient has consented to  Procedure(s): LAPAROSCOPIC LEFT INGUINAL HERNIA REPAIR WITH MESH (Left) as a surgical intervention.  The patient's history has been reviewed, patient examined, no change in status, stable for surgery.  I have reviewed the patient's chart and labs.  Questions were answered to the patient's satisfaction.     Ralene Ok

## 2022-09-20 NOTE — Anesthesia Procedure Notes (Signed)
Procedure Name: Intubation Date/Time: 09/20/2022 11:54 AM  Performed by: Eben Burow, CRNAPre-anesthesia Checklist: Patient identified, Emergency Drugs available, Suction available, Patient being monitored and Timeout performed Patient Re-evaluated:Patient Re-evaluated prior to induction Oxygen Delivery Method: Circle system utilized Preoxygenation: Pre-oxygenation with 100% oxygen Induction Type: IV induction Ventilation: Mask ventilation without difficulty Laryngoscope Size: Mac and 4 Grade View: Grade I Tube type: Oral Tube size: 7.0 mm Number of attempts: 1 Airway Equipment and Method: Stylet Placement Confirmation: ETT inserted through vocal cords under direct vision, positive ETCO2 and breath sounds checked- equal and bilateral Secured at: 21 cm Tube secured with: Tape Dental Injury: Teeth and Oropharynx as per pre-operative assessment

## 2022-09-20 NOTE — Anesthesia Preprocedure Evaluation (Addendum)
Anesthesia Evaluation  Patient identified by MRN, date of birth, ID band Patient awake    Reviewed: Allergy & Precautions, H&P , NPO status , Patient's Chart, lab work & pertinent test results  Airway Mallampati: I  TM Distance: >3 FB Neck ROM: Full    Dental no notable dental hx. (+) Teeth Intact, Dental Advisory Given, Caps   Pulmonary neg pulmonary ROS, asthma    Pulmonary exam normal breath sounds clear to auscultation       Cardiovascular Exercise Tolerance: Good negative cardio ROS Normal cardiovascular exam Rhythm:Regular Rate:Normal     Neuro/Psych   Anxiety     negative neurological ROS  negative psych ROS   GI/Hepatic negative GI ROS, Neg liver ROS,,,  Endo/Other  negative endocrine ROSHypothyroidism    Renal/GU negative Renal ROS  negative genitourinary   Musculoskeletal negative musculoskeletal ROS (+)    Abdominal   Peds negative pediatric ROS (+)  Hematology negative hematology ROS (+)   Anesthesia Other Findings   Reproductive/Obstetrics negative OB ROS                             Anesthesia Physical Anesthesia Plan  ASA: 3  Anesthesia Plan: General   Post-op Pain Management: Minimal or no pain anticipated   Induction: Intravenous  PONV Risk Score and Plan: 3 and Ondansetron, Treatment may vary due to age or medical condition and TIVA  Airway Management Planned: Oral ETT  Additional Equipment: None  Intra-op Plan:   Post-operative Plan: Extubation in OR  Informed Consent: I have reviewed the patients History and Physical, chart, labs and discussed the procedure including the risks, benefits and alternatives for the proposed anesthesia with the patient or authorized representative who has indicated his/her understanding and acceptance.       Plan Discussed with: Anesthesiologist and CRNA  Anesthesia Plan Comments: (  )        Anesthesia Quick  Evaluation

## 2022-09-20 NOTE — Op Note (Signed)
09/20/2022  11:34 AM  PATIENT:  Elberta Leatherwood  77 y.o. female  PRE-OPERATIVE DIAGNOSIS:  LEFT INGUINAL HERNIA  POST-OPERATIVE DIAGNOSIS:  LEFT INDRECT INGUINAL HERNIA  PROCEDURE:  Procedure(s): LAPAROSCOPIC LEFT INGUINAL HERNIA REPAIR WITH MESH (Left)  SURGEON:  Surgeon(s) and Role:    * Ralene Ok, MD - Primary  ANESTHESIA:   local and general  EBL:  minimal   BLOOD ADMINISTERED:none  DRAINS: none   LOCAL MEDICATIONS USED:  BUPIVICAINE   SPECIMEN:  No Specimen  DISPOSITION OF SPECIMEN:  N/A  COUNTS:  YES  TOURNIQUET:  * No tourniquets in log *  DICTATION: .Dragon Dictation  Counts: reported as correct x 2   Findings:  The patient had a moderate sized left indirect hernia and a moderated sized cord lipoma   Indications for procedure:  The patient is a 77 year old female with a left inguinal hernia for several months. Patient complained of symptomatology to the left inguinal area. The patient was taken back for elective inguinal hernia repair.   Details of the procedure: The patient was taken back to the operating room. The patient was placed in supine position with bilateral SCDs in place.  The patient was prepped and draped in the usual sterile fashion.  After appropriate anitbiotics were confirmed, a time-out was confirmed and all facts were verified.   0.25% Marcaine was used to infiltrate the umbilical area. A 11-blade was used to cut down the skin and blunt dissection was used to get the anterior fashion.  The anterior fascia was incised approximately 1 cm and the muscles were retracted laterally. Blunt dissection was then used to create a space in the preperitoneal area. At this time a 10 mm camera was then introduced into the space and advanced the pubic tubercle and a 12 mm trocar was placed over this and insufflation was started.  At this time and space was created from medial to laterally the preperitoneal space.  Cooper's ligament was initially cleaned off.   The hernia sac was identified in the indirect space. Dissection of the hernia sac and round ligament was undertaken .  Once the hernia sac and cord lipoma were reduced I catuerized the round ligament.   Once the hernia sac was taken down to approximately the umbilicus a Left Bard 3D Max mesh, size: Large, was  introduced into the preperitoneal space.  The mesh was brought over to cover the direct and indirect hernia spaces.  This was anchored into place and secured to Cooper's ligament with 4.39m staples from a [place] hernia stapler. It was anchored to the anterior abdominal wall with 4.8 mm staples. The hernia sac was seen lying posterior to the mesh. There was no staples placed laterally. The insufflation was evacuated and the peritoneum was seen posterior to the mesh. The trochars were removed. The anterior fascia was reapproximated using #1 Vicryl on a UR- 6.  Intra-abdominal air was evacuated and the Veress needle removed. The skin was reapproximated using 4-0 Monocryl subcuticular fashion and Dermabond. The patient was awakened from general anesthesia and taken to recovery in stable condition.   PLAN OF CARE: Discharge to home after PACU  PATIENT DISPOSITION:  PACU - hemodynamically stable.   Delay start of Pharmacological VTE agent (>24hrs) due to surgical blood loss or risk of bleeding: not applicable

## 2022-09-20 NOTE — Transfer of Care (Signed)
Immediate Anesthesia Transfer of Care Note  Patient: Ann Fowler  Procedure(s) Performed: LAPAROSCOPIC LEFT INGUINAL HERNIA REPAIR WITH MESH (Left)  Patient Location: PACU  Anesthesia Type:General  Level of Consciousness: awake and patient cooperative  Airway & Oxygen Therapy: Patient Spontanous Breathing and Patient connected to face mask oxygen  Post-op Assessment: Report given to RN and Post -op Vital signs reviewed and stable  Post vital signs: Reviewed and stable  Last Vitals:  Vitals Value Taken Time  BP 145/93 09/20/22 1147  Temp    Pulse 74 09/20/22 1152  Resp 14 09/20/22 1152  SpO2 100 % 09/20/22 1152  Vitals shown include unvalidated device data.  Last Pain:  Vitals:   09/20/22 0842  TempSrc:   PainSc: 0-No pain      Patients Stated Pain Goal: 3 ([id number])  Complications: No notable events documented.

## 2022-09-21 ENCOUNTER — Encounter (HOSPITAL_COMMUNITY): Payer: Self-pay | Admitting: General Surgery

## 2022-09-23 DIAGNOSIS — E063 Autoimmune thyroiditis: Secondary | ICD-10-CM | POA: Diagnosis not present

## 2022-09-23 DIAGNOSIS — H6502 Acute serous otitis media, left ear: Secondary | ICD-10-CM | POA: Diagnosis not present

## 2022-09-23 DIAGNOSIS — J0100 Acute maxillary sinusitis, unspecified: Secondary | ICD-10-CM | POA: Diagnosis not present

## 2022-09-23 DIAGNOSIS — Z681 Body mass index (BMI) 19 or less, adult: Secondary | ICD-10-CM | POA: Diagnosis not present

## 2022-11-30 DIAGNOSIS — Z1331 Encounter for screening for depression: Secondary | ICD-10-CM | POA: Diagnosis not present

## 2022-11-30 DIAGNOSIS — F0390 Unspecified dementia without behavioral disturbance: Secondary | ICD-10-CM | POA: Diagnosis not present

## 2022-11-30 DIAGNOSIS — Z681 Body mass index (BMI) 19 or less, adult: Secondary | ICD-10-CM | POA: Diagnosis not present

## 2022-11-30 DIAGNOSIS — Z0001 Encounter for general adult medical examination with abnormal findings: Secondary | ICD-10-CM | POA: Diagnosis not present

## 2022-11-30 DIAGNOSIS — R4182 Altered mental status, unspecified: Secondary | ICD-10-CM | POA: Diagnosis not present

## 2022-11-30 DIAGNOSIS — E063 Autoimmune thyroiditis: Secondary | ICD-10-CM | POA: Diagnosis not present

## 2022-12-13 DIAGNOSIS — F0390 Unspecified dementia without behavioral disturbance: Secondary | ICD-10-CM | POA: Diagnosis not present

## 2022-12-14 DIAGNOSIS — L820 Inflamed seborrheic keratosis: Secondary | ICD-10-CM | POA: Diagnosis not present

## 2022-12-15 DIAGNOSIS — Z6821 Body mass index (BMI) 21.0-21.9, adult: Secondary | ICD-10-CM | POA: Diagnosis not present

## 2022-12-15 DIAGNOSIS — Z1231 Encounter for screening mammogram for malignant neoplasm of breast: Secondary | ICD-10-CM | POA: Diagnosis not present

## 2022-12-15 DIAGNOSIS — N952 Postmenopausal atrophic vaginitis: Secondary | ICD-10-CM | POA: Diagnosis not present

## 2022-12-15 DIAGNOSIS — Z124 Encounter for screening for malignant neoplasm of cervix: Secondary | ICD-10-CM | POA: Diagnosis not present

## 2023-03-06 DIAGNOSIS — F0390 Unspecified dementia without behavioral disturbance: Secondary | ICD-10-CM | POA: Diagnosis not present

## 2023-04-08 ENCOUNTER — Ambulatory Visit
Admission: EM | Admit: 2023-04-08 | Discharge: 2023-04-08 | Disposition: A | Discharge status: Home / Self Care | Payer: Medicare PPO | Attending: Family Medicine | Admitting: Family Medicine

## 2023-04-08 ENCOUNTER — Encounter: Payer: Self-pay | Admitting: Emergency Medicine

## 2023-04-08 ENCOUNTER — Other Ambulatory Visit: Payer: Self-pay

## 2023-04-08 DIAGNOSIS — J069 Acute upper respiratory infection, unspecified: Secondary | ICD-10-CM | POA: Diagnosis not present

## 2023-04-08 DIAGNOSIS — B9789 Other viral agents as the cause of diseases classified elsewhere: Secondary | ICD-10-CM | POA: Diagnosis not present

## 2023-04-08 DIAGNOSIS — R059 Cough, unspecified: Secondary | ICD-10-CM | POA: Diagnosis not present

## 2023-04-08 DIAGNOSIS — Z1152 Encounter for screening for COVID-19: Secondary | ICD-10-CM | POA: Diagnosis not present

## 2023-04-08 MED ORDER — PROMETHAZINE-DM 6.25-15 MG/5ML PO SYRP: 5.0000 mL | ORAL_SOLUTION | Freq: Four times a day (QID) | ORAL | 0 refills | Status: AC | PRN

## 2023-04-08 NOTE — ED Provider Notes (Signed)
[facility]    CSN: [id number] Arrival date & time: 04/08/23  1027      History   Chief Complaint Chief Complaint  Patient presents with   Nasal Congestion    HPI Ann Fowler is a 77 y.o. female.   Presenting today with history of nasal congestion, headache, sinus pressure, bilateral ear pressure.  Denies fever, chills, chest pain, shortness of breath, abdominal pain, nausea vomiting or diarrhea.  Does have a history of asthma on albuterol as needed, has not needed her inhaler more often than usual since onset of symptoms.  Took Mucinex once with minimal relief.    Past Medical History:  Diagnosis Date   Allergy to environmental factors    Anxiety    Asthma    Adult onset   Hypothyroidism    Thyroid disease     Patient Active Problem List   Diagnosis Date Noted   Asthma, chronic, unspecified asthma severity, with acute exacerbation 03/21/2018   Asthma 05/12/2017   Allergic rhinitis 05/12/2017    Past Surgical History:  Procedure Laterality Date   INGUINAL HERNIA REPAIR Left 09/20/2022   Procedure: LAPAROSCOPIC LEFT INGUINAL HERNIA REPAIR WITH MESH;  Surgeon: Axel Filler, MD;  Location: [facility];  Service: General;  Laterality: Left;   NECK SURGERY      OB History   No obstetric history on file.      Home Medications    Prior to Admission medications   Medication Sig Start Date End Date Taking? Authorizing Provider  promethazine-dextromethorphan (PROMETHAZINE-DM) 6.25-15 MG/5ML syrup Take 5 mLs by mouth 4 (four) times daily as needed. 04/08/23  Yes Particia Nearing, PA-C  albuterol (PROVENTIL) (2.5 MG/3ML) 0.083% nebulizer solution Take 3 mLs (2.5 mg total) by nebulization every 12 (twelve) hours for 5 days. 03/22/18 03/27/18  Lahoma Crocker, MD  albuterol (PROVENTIL) (2.5 MG/3ML) 0.083% nebulizer solution Inhale 3 mLs (2.5 mg total) into the lungs every 6 (six) hours as needed for wheezing or shortness of breath. 10/02/18   Leslye Peer, MD  ALPRAZolam Prudy Feeler) 0.5 MG tablet Take 0.5 mg by mouth every 6 (six) hours as needed for anxiety.  02/20/18   [provider]  cyanocobalamin (,VITAMIN B-12,) 1000 MCG/ML injection Inject 1,000 mcg into the muscle every 30 (thirty) days.  Patient not taking: Reported on 09/19/2022 03/09/18   [provider]  estradiol (ESTRACE) 0.1 MG/GM vaginal cream Place 1 Applicatorful vaginally See admin instructions. Insert 1 applicatorful three times a month 02/13/18   [provider]  ibuprofen (ADVIL,MOTRIN) 200 MG tablet Take 200 mg by mouth every 6 (six) hours as needed (for pain or headaches).    [provider]  levothyroxine (SYNTHROID, LEVOTHROID) 75 MCG tablet Take 75 mcg by mouth daily. 03/11/18   [provider]  traMADol (ULTRAM) 50 MG tablet Take 1 tablet (50 mg total) by mouth every 6 (six) hours as needed. 09/20/22 09/20/23  Axel Filler, MD  triamcinolone cream (KENALOG) 0.1 % Apply 1 application topically 2 (two) times daily. Patient not taking: Reported on 09/19/2022 04/15/21   Vivi Barrack, DPM    Family History Family History  Problem Relation Age of Onset   Cancer Mother    Liver cancer Father     Social History Social History   Tobacco Use   Smoking status: Never   Smokeless tobacco: Never  Vaping Use   Vaping status: Never Used  Substance Use Topics   Alcohol use: Not Currently  Drug use: No     Allergies   Patient has no known allergies.   Review of Systems Review of Systems Per HPI  Physical Exam Triage Vital Signs ED Triage Vitals  Encounter Vitals Group     BP 04/08/23 1057 (!) 147/95     Systolic BP Percentile --      Diastolic BP Percentile --      Pulse Rate 04/08/23 1057 67     Resp 04/08/23 1057 20     Temp 04/08/23 1057 97.9 F (36.6 C)     Temp Source 04/08/23 1057 Oral     SpO2 04/08/23 1057 96 %     Weight --      Height --      Head Circumference --      Peak Flow --      Pain Score  04/08/23 1055 2     Pain Loc --      Pain Education --      Exclude from Growth Chart --    No data found.  Updated Vital Signs BP (!) 147/95 (BP Location: Right Arm)   Pulse 67   Temp 97.9 F (36.6 C) (Oral)   Resp 20   SpO2 96%   Visual Acuity Right Eye Distance:   Left Eye Distance:   Bilateral Distance:    Right Eye Near:   Left Eye Near:    Bilateral Near:     Physical Exam Vitals and nursing note reviewed.  Constitutional:      Appearance: Normal appearance.  HENT:     Head: Atraumatic.     Right Ear: Tympanic membrane and external ear normal.     Left Ear: Tympanic membrane and external ear normal.     Nose: Rhinorrhea present.     Mouth/Throat:     Mouth: Mucous membranes are moist.     Pharynx: Posterior oropharyngeal erythema present.  Eyes:     Extraocular Movements: Extraocular movements intact.     Conjunctiva/sclera: Conjunctivae normal.  Cardiovascular:     Rate and Rhythm: Normal rate and regular rhythm.     Heart sounds: Normal heart sounds.  Pulmonary:     Effort: Pulmonary effort is normal.     Breath sounds: Normal breath sounds. No wheezing.  Musculoskeletal:        General: Normal range of motion.     Cervical back: Normal range of motion and neck supple.  Skin:    General: Skin is warm and dry.  Neurological:     Mental Status: She is alert and oriented to person, place, and time.  Psychiatric:        Mood and Affect: Mood normal.        Thought Content: Thought content normal.      UC Treatments / Results  Labs (all labs ordered are listed, but only abnormal results are displayed) Labs Reviewed  SARS CORONAVIRUS 2 (TAT 6-24 HRS)    EKG   Radiology No results found.  Procedures Procedures (including critical care time)  Medications Ordered in UC Medications - No data to display  Initial Impression / Assessment and Plan / UC Course  I have reviewed the triage vital signs and the nursing notes.  Pertinent labs &  imaging results that were available during my care of the patient were reviewed by me and considered in my medical decision making (see chart for details).     Vital signs and exam reassuring and consistent with viral respiratory infection.  COVID testing pending, candidate for Paxlovid if positive.  Treat with Phenergan DM, supportive over-the-counter medications and home care additionally.  Return for worsening symptoms.  Final Clinical Impressions(s) / UC Diagnoses   Final diagnoses:  Viral URI with cough     Discharge Instructions      You may take Coricidin HBP, Flonase nasal spray twice daily, use saline sinus rinses and humidifiers.  I have also sent in a good cough syrup.  Your COVID results should be back tomorrow and if positive, will discuss antiviral options for you.    ED Prescriptions     Medication Sig Dispense Auth. Provider   promethazine-dextromethorphan (PROMETHAZINE-DM) 6.25-15 MG/5ML syrup Take 5 mLs by mouth 4 (four) times daily as needed. 100 mL Particia Nearing, [place]      PDMP not reviewed this encounter.   Particia Nearing, [place] 04/08/23 1148

## 2023-04-08 NOTE — ED Triage Notes (Addendum)
Pt reports nasal congestion, headache, bilateral ear pressure x2 days.

## 2023-04-08 NOTE — Discharge Instructions (Signed)
You may take Coricidin HBP, Flonase nasal spray twice daily, use saline sinus rinses and humidifiers.  I have also sent in a good cough syrup.  Your COVID results should be back tomorrow and if positive, will discuss antiviral options for you.

## 2023-04-09 LAB — SARS CORONAVIRUS 2 (TAT 6-24 HRS): SARS Coronavirus 2: NEGATIVE

## 2023-04-20 DIAGNOSIS — L820 Inflamed seborrheic keratosis: Secondary | ICD-10-CM | POA: Diagnosis not present

## 2023-06-23 DIAGNOSIS — J0100 Acute maxillary sinusitis, unspecified: Secondary | ICD-10-CM | POA: Diagnosis not present

## 2023-06-23 DIAGNOSIS — I10 Essential (primary) hypertension: Secondary | ICD-10-CM | POA: Diagnosis not present

## 2023-06-28 DIAGNOSIS — L820 Inflamed seborrheic keratosis: Secondary | ICD-10-CM | POA: Diagnosis not present

## 2023-11-16 DIAGNOSIS — Z681 Body mass index (BMI) 19 or less, adult: Secondary | ICD-10-CM | POA: Diagnosis not present

## 2023-11-16 DIAGNOSIS — F419 Anxiety disorder, unspecified: Secondary | ICD-10-CM | POA: Diagnosis not present

## 2024-01-09 DIAGNOSIS — F0390 Unspecified dementia without behavioral disturbance: Secondary | ICD-10-CM | POA: Diagnosis not present

## 2024-01-10 DIAGNOSIS — E063 Autoimmune thyroiditis: Secondary | ICD-10-CM | POA: Diagnosis not present

## 2024-02-19 ENCOUNTER — Encounter: Payer: Self-pay | Admitting: Physician Assistant

## 2024-05-18 NOTE — Progress Notes (Incomplete)
 Assessment/Plan:     Ann Fowler is a very pleasant 78 y.o. year old RH female with a history of anxiety, asthma, hypothyroidism, vit D deficiency, anemia seen today for evaluation of memory loss. She was diagnosed with dementia by her now retired PCP about 1 year ago, placed on memantine 5 mg daily, tolerating well. Unfortunately, there is noticeable memory decline, easily forgetful, with MoCA today at 4/30. We discussed the role of adding another agent to the regimen in an effort to slow down her memory decline after increasing the memantine dose. She is still able to participate on her ADLs, no longer drives. Mood is anxious with moments of agitation followed by PCP.   Demential likely due to Alzheimer's disease   MRI brain without contrast to assess for underlying structural abnormality and assess vascular load  Continue memantine, increase to  10 mg daily. Side effects were discussed  Check B12, TSH, B1 Alcohol reduction counseled Recommend good control of cardiovascular risk factors.   Continue to control mood as per PCP Folllow up in 4 months   Subjective:    The patient is accompanied by her husband  who supplements  the history.    How long did patient have memory difficulties?  For about 5 years, husband says, with increasing difficulty remembering new information, recent conversations, names. LTM is very affected, including remembering her own children's names or their relationship to her. She confabulates more often, especially over the last 3 years. repeats oneself?  Endorsed a lot. Disoriented when walking into a room? Always wants to go a different place, anywhere but here    Leaving objects in unusual places?  Denies. She hides things and then she cannot find them till later, such as  dirty clothes in the fridge.  Wandering behavior? Endorsed. 6 months ago she began to disappear, ending up in a neighbor's house.  Any personality changes, or depression, anxiety?  She has a tendency to be argumentative. Hallucinations or paranoia? There are some people who live upstairs and they steal her things Seizures? Denies.    Any sleep changes?  Sleeps well, denies frequent nightmares or dream reenactment, other REM behavior or sleepwalking   Sleep apnea? Denies.   Any hygiene concerns?  Denies. The opposite, she is very clean.Everything goes to the washing machine  Independent of bathing and dressing? Needs  some help with choosing clothes. Does the patient need help with medications? Husband is in charge   Who is in charge of the finances?  Husband is in charge     Any changes in appetite? She likes a lot of junk food. She may forget that she did not eat. Drinks plenty fluids Patient have trouble swallowing?  Denies.   Does the patient cook? No  Any headaches?  Denies.   Chronic pain? Denies.   Ambulates with difficulty? Denies. Walks and exercises on a regular basis Recent falls or head injuries? In 2023 had a mechanical fall, was found by spouse, etiology was unclear, with negative imaging at the ED   Vision changes?  Denies any new issues.    Any strokelike symptoms? Denies.   Any tremors? Denies.   Any anosmia? Yes for the last 2020 Any incontinence of urine? Endorsed, it has happened in the bed  Any bowel dysfunction?A few accidents, last 2 weeks ago  Patient lives with husband, daughter and grandchildren   History of heavy alcohol intake? 1/2 bottle wine at night  History of heavy tobacco use?  Denies.   Family history of dementia? Mother and MGM had dementia ? type    Does patient drive? No longer drives   Some college    No Known Allergies  Current Outpatient Medications  Medication Instructions   albuterol  (PROVENTIL ) 2.5 mg, Nebulization, Every 12 hours   albuterol  (PROVENTIL ) 2.5 mg, Inhalation, Every 6 hours PRN   ALPRAZolam  (XANAX ) 0.5 mg, Every 6 hours PRN   estradiol (ESTRACE) 0.1 MG/GM vaginal cream 1 Applicatorful, See  admin instructions   ibuprofen (ADVIL) 200 mg, Every 6 hours PRN   levothyroxine  (SYNTHROID ) 75 mcg, Daily   memantine (NAMENDA) 10 mg, Oral, Daily   promethazine -dextromethorphan (PROMETHAZINE -DM) 6.25-15 MG/5ML syrup 5 mLs, Oral, 4 times daily PRN   QUEtiapine  (SEROQUEL ) 100 mg, Daily     VITALS:   Vitals:   05/20/24 1300  BP: (!) 148/93  Pulse: 71  SpO2: 97%  Weight: 124 lb 6.4 oz (56.4 kg)     Physical Exam  :    05/20/2024    4:00 PM  Montreal Cognitive Assessment   Visuospatial/ Executive (0/5) 1  Naming (0/3) 0  Attention: Read list of digits (0/2) 1  Attention: Read list of letters (0/1) 0  Attention: Serial 7 subtraction starting at 100 (0/3) 0  Language: Repeat phrase (0/2) 0  Language : Fluency (0/1) 0  Abstraction (0/2) 0  Delayed Recall (0/5) 0  Orientation (0/6) 2  Total 4  Adjusted Score (based on education) 4        No data to display             HEENT:  Normocephalic, atraumatic.  The superficial temporal arteries are without ropiness or tenderness. Cardiovascular: Regular rate and rhythm. Lungs: Clear to auscultation bilaterally. Neck: There are no carotid bruits noted bilaterally. Orientation:  Alert , not oriented to person, place  or time. No aphasia or dysarthria. Fund of knowledge is reduced. Recent and remote memory impaired.  Attention and concentration are reduced.  Able to name objects and unable to repeat phrases.   Delayed recall  0/5 .  Cranial nerves: There is good facial symmetry. Extraocular muscles are intact and visual fields are full to confrontational testing. Speech is fluent and clear, tangential. No tongue deviation. Hearing is intact to conversational tone.  Tone: Tone is good throughout. Sensation: Sensation is intact to light touch.  Vibration is intact at the bilateral big toe.  Coordination: The patient has no difficulty with RAM's or FNF bilaterally. Normal finger to nose  Motor: Strength is 5/5 in the bilateral upper  and lower extremities. There is no pronator drift. There are no fasciculations noted. DTR's: Deep tendon reflexes are 2/4 bilaterally. Gait and Station: The patient is able to ambulate without difficulty. Gait is cautious and narrow. Stride length is normal.        Thank you for allowing us  the opportunity to participate in the care of this nice patient. Please do not hesitate to contact us  for any questions or concerns.   Total time spent on today's visit was 61 minutes dedicated to this patient today, preparing to see patient, examining the patient, ordering tests and/or medications and counseling the patient, documenting clinical information in the EHR or other health record, independently interpreting results and communicating results to the patient/family, discussing treatment and goals, answering patient's questions and coordinating care.  Cc:  Bertell Satterfield, MD  Camie Sevin 05/20/2024 4:57 PM

## 2024-05-20 ENCOUNTER — Ambulatory Visit (INDEPENDENT_AMBULATORY_CARE_PROVIDER_SITE_OTHER): Admitting: Physician Assistant

## 2024-05-20 ENCOUNTER — Other Ambulatory Visit

## 2024-05-20 ENCOUNTER — Ambulatory Visit (INDEPENDENT_AMBULATORY_CARE_PROVIDER_SITE_OTHER)

## 2024-05-20 ENCOUNTER — Encounter: Payer: Self-pay | Admitting: Physician Assistant

## 2024-05-20 VITALS — BP 148/93 | HR 71 | Wt 124.4 lb

## 2024-05-20 DIAGNOSIS — G309 Alzheimer's disease, unspecified: Secondary | ICD-10-CM | POA: Diagnosis not present

## 2024-05-20 DIAGNOSIS — F02818 Dementia in other diseases classified elsewhere, unspecified severity, with other behavioral disturbance: Secondary | ICD-10-CM

## 2024-05-20 DIAGNOSIS — R413 Other amnesia: Secondary | ICD-10-CM | POA: Diagnosis not present

## 2024-05-20 MED ORDER — MEMANTINE HCL 10 MG PO TABS
10.0000 mg | ORAL_TABLET | Freq: Every day | ORAL | 3 refills | Status: AC
  Filled 2024-08-23: qty 90, 90d supply, fill #0

## 2024-05-20 NOTE — Patient Instructions (Addendum)
 It was a pleasure to see you today at our office.   Recommendations:   MRI of the brain, the radiology office will call you to arrange you appointment  Check labs today  Continue Memantine, recommend to increase to 10 mg twice daily. Side effects were discussed  Talk to the primary doctor about increase the Seroquel  Follow up in  months     [email or web address]   RECOMMENDATIONS FOR ALL PATIENTS WITH MEMORY PROBLEMS: 1. Continue to exercise (Recommend 30 minutes of walking everyday, or 3 hours every week) 2. Increase social interactions - continue going to [place] and enjoy social gatherings with friends and family 3. Eat healthy, avoid fried foods and eat more fruits and vegetables 4. Maintain adequate blood pressure, blood sugar, and blood cholesterol level. Reducing the risk of stroke and cardiovascular disease also helps promoting better memory. 5. Avoid stressful situations. Live a simple life and avoid aggravations. Organize your time and prepare for the next day in anticipation. 6. Sleep well, avoid any interruptions of sleep and avoid any distractions in the bedroom that may interfere with adequate sleep quality 7. Avoid sugar, avoid sweets as there is a strong link between excessive sugar intake, diabetes, and cognitive impairment We discussed the Mediterranean diet, which has been shown to help patients reduce the risk of progressive memory disorders and reduces cardiovascular risk. This includes eating fish, eat fruits and green leafy vegetables, nuts like almonds and hazelnuts, walnuts, and also use olive oil. Avoid fast foods and fried foods as much as possible. Avoid sweets and sugar as sugar use has been linked to worsening of memory function.  There is always a concern of gradual progression of memory problems. If this is the case, then we may need to adjust level of care according to patient needs. Support, both to the  patient and caregiver, should then be put into place.         FALL PRECAUTIONS: Be cautious when walking. Scan the area for obstacles that may increase the risk of trips and falls. When getting up in the mornings, sit up at the edge of the bed for a few minutes before getting out of bed. Consider elevating the bed at the head end to avoid drop of blood pressure when getting up. Walk always in a well-lit room (use night lights in the walls). Avoid area rugs or power cords from appliances in the middle of the walkways. Use a walker or a cane if necessary and consider physical therapy for balance exercise. Get your eyesight checked regularly.  FINANCIAL OVERSIGHT: Supervision, especially oversight when making financial decisions or transactions is also recommended.  HOME SAFETY: Consider the safety of the kitchen when operating appliances like stoves, microwave oven, and blender. Consider having supervision and share cooking responsibilities until no longer able to participate in those. Accidents with firearms and other hazards in the house should be identified and addressed as well.   ABILITY TO BE LEFT ALONE: If patient is unable to contact 911 operator, consider using LifeLine, or when the need is there, arrange for someone to stay with patients. Smoking is a fire hazard, consider supervision or cessation. Risk of wandering should be assessed by caregiver and if detected at any point, supervision and safe proof recommendations should be instituted.  MEDICATION SUPERVISION: Inability to self-administer medication needs to be constantly addressed. Implement a mechanism to ensure safe administration of the medications.      Mediterranean Diet A Mediterranean diet refers to food and lifestyle choices  that are based on the traditions of countries located on the [employer]. This way of eating has been shown to help prevent certain conditions and improve outcomes for people who have chronic  diseases, like kidney disease and heart disease. What are tips for following this plan? Lifestyle  Cook and eat meals together with your family, when possible. Drink enough fluid to keep your urine clear or pale yellow. Be physically active every day. This includes: Aerobic exercise like running or swimming. Leisure activities like gardening, walking, or housework. Get 7-8 hours of sleep each night. If recommended by your health care provider, drink red wine in moderation. This means 1 glass a day for nonpregnant women and 2 glasses a day for men. A glass of wine equals 5 oz (150 mL). Reading food labels  Check the serving size of packaged foods. For foods such as rice and pasta, the serving size refers to the amount of cooked product, not dry. Check the total fat in packaged foods. Avoid foods that have saturated fat or trans fats. Check the ingredients list for added sugars, such as corn syrup. Shopping  At the grocery store, buy most of your food from the areas near the walls of the store. This includes: Fresh fruits and vegetables (produce). Grains, beans, nuts, and seeds. Some of these may be available in unpackaged forms or large amounts (in bulk). Fresh seafood. Poultry and eggs. Low-fat dairy products. Buy whole ingredients instead of prepackaged foods. Buy fresh fruits and vegetables in-season from local farmers markets. Buy frozen fruits and vegetables in resealable bags. If you do not have access to quality fresh seafood, buy precooked frozen shrimp or canned fish, such as tuna, salmon, or sardines. Buy small amounts of raw or cooked vegetables, salads, or olives from the deli or salad bar at your store. Stock your pantry so you always have certain foods on hand, such as olive oil, canned tuna, canned tomatoes, rice, pasta, and beans. Cooking  Cook foods with extra-virgin olive oil instead of using butter or other vegetable oils. Have meat as a side dish, and have vegetables  or grains as your main dish. This means having meat in small portions or adding small amounts of meat to foods like pasta or stew. Use beans or vegetables instead of meat in common dishes like chili or lasagna. Experiment with different cooking methods. Try roasting or broiling vegetables instead of steaming or sauteing them. Add frozen vegetables to soups, stews, pasta, or rice. Add nuts or seeds for added healthy fat at each meal. You can add these to yogurt, salads, or vegetable dishes. Marinate fish or vegetables using olive oil, lemon juice, garlic, and fresh herbs. Meal planning  Plan to eat 1 vegetarian meal one day each week. Try to work up to 2 vegetarian meals, if possible. Eat seafood 2 or more times a week. Have healthy snacks readily available, such as: Vegetable sticks with hummus. Greek yogurt. Fruit and nut trail mix. Eat balanced meals throughout the week. This includes: Fruit: 2-3 servings a day Vegetables: 4-5 servings a day Low-fat dairy: 2 servings a day Fish, poultry, or lean meat: 1 serving a day Beans and legumes: 2 or more servings a week Nuts and seeds: 1-2 servings a day Whole grains: 6-8 servings a day Extra-virgin olive oil: 3-4 servings a day Limit red meat and sweets to only a few servings a month What are my food choices? Mediterranean diet Recommended Grains: Whole-grain pasta. Brown rice. Bulgar wheat. Polenta.  Couscous. Whole-wheat bread. [place] Made[place]. Vegetables: Artichokes. Beets. Broccoli. Cabbage. Carrots. Eggplant. Green beans. Chard. Kale. Spinach. Onions. Leeks. Peas. Squash. Tomatoes. Peppers. Radishes. Fruits: Apples. Apricots. Avocado. Berries. Bananas. Cherries. Dates. Figs. Grapes. Lemons. Melon. Oranges. Peaches. Plums. Pomegranate. Meats and other protein foods: Beans. Almonds. Sunflower seeds. Pine nuts. Peanuts. Cod. Salmon. Scallops. Shrimp. Tuna. Tilapia. Clams. Oysters. Eggs. Dairy: Low-fat milk. Cheese. Greek yogurt. Beverages:  Water. Red wine. Herbal tea. Fats and oils: Extra virgin olive oil. Avocado oil. Grape seed oil. Sweets and desserts: Greek yogurt with honey. Baked apples. Poached pears. Trail mix. Seasoning and other foods: Basil. Cilantro. Coriander. Cumin. Mint. Parsley. Sage. Rosemary. Tarragon. Garlic. Oregano. Thyme. Pepper. Balsalmic vinegar. Tahini. Hummus. Tomato sauce. Olives. Mushrooms. Limit these Grains: Prepackaged pasta or rice dishes. Prepackaged cereal with added sugar. Vegetables: Deep fried potatoes (french fries). Fruits: Fruit canned in syrup. Meats and other protein foods: Beef. Pork. Lamb. Poultry with skin. Hot dogs. [place]. Dairy: Ice cream. Sour cream. Whole milk. Beverages: Juice. Sugar-sweetened soft drinks. Beer. Liquor and spirits. Fats and oils: Butter. Canola oil. Vegetable oil. Beef fat (tallow). Lard. Sweets and desserts: Cookies. Cakes. Pies. Candy. Seasoning and other foods: Mayonnaise. Premade sauces and marinades. The items listed may not be a complete list. Talk with your dietitian about what dietary choices are right for you. Summary The Mediterranean diet includes both food and lifestyle choices. Eat a variety of fresh fruits and vegetables, beans, nuts, seeds, and whole grains. Limit the amount of red meat and sweets that you eat. Talk with your health care provider about whether it is safe for you to drink red wine in moderation. This means 1 glass a day for nonpregnant women and 2 glasses a day for men. A glass of wine equals 5 oz (150 mL). This information is not intended to replace advice given to you by your health care provider. Make sure you discuss any questions you have with your health care provider. Document Released: 02/25/2016 Document Revised: 03/29/2016 Document Reviewed: 02/25/2016 Elsevier Interactive Patient Education  2017 [employer].

## 2024-05-21 ENCOUNTER — Ambulatory Visit: Payer: Self-pay | Admitting: Physician Assistant

## 2024-05-23 LAB — VITAMIN B12: Vitamin B-12: 219 pg/mL (ref 200–1100)

## 2024-05-23 LAB — VITAMIN B1: Vitamin B1 (Thiamine): 7 nmol/L — ABNORMAL LOW (ref 8–30)

## 2024-05-30 ENCOUNTER — Telehealth: Payer: Self-pay | Admitting: Physician Assistant

## 2024-05-30 NOTE — Telephone Encounter (Signed)
 I left message to call [facility], they have been trying to get her to schedule.

## 2024-05-30 NOTE — Telephone Encounter (Signed)
 David(Spouse) called stating that he has not heard from the people that is suppose to call to schedule a MRI. He would like the phone number so he can call to get that scheduled.   PH: [phone]

## 2024-06-12 ENCOUNTER — Encounter: Payer: Self-pay | Admitting: Physician Assistant

## 2024-06-17 ENCOUNTER — Telehealth: Payer: Self-pay | Admitting: Physician Assistant

## 2024-06-17 ENCOUNTER — Other Ambulatory Visit: Payer: Self-pay | Admitting: Physician Assistant

## 2024-06-17 MED ORDER — DIAZEPAM 2 MG PO TABS: ORAL_TABLET | ORAL | 0 refills | Status: AC

## 2024-06-17 NOTE — Telephone Encounter (Signed)
 Pt has MRI sched 06/21/24 and would like Rx to calm and keep still pls send to [facility] [address] [place], [place] [address]

## 2024-06-21 ENCOUNTER — Ambulatory Visit
Admission: RE | Admit: 2024-06-21 | Discharge: 2024-06-21 | Disposition: A | Source: Ambulatory Visit | Attending: Physician Assistant | Admitting: Physician Assistant

## 2024-06-21 DIAGNOSIS — R413 Other amnesia: Secondary | ICD-10-CM

## 2024-06-25 ENCOUNTER — Telehealth: Payer: Self-pay | Admitting: Physician Assistant

## 2024-06-25 NOTE — Telephone Encounter (Signed)
 Pt's husband came in this morning and their attorney needs a letter of Pt's  diagnoses. They needs that letter for [employer]ians[employer]. Thanks.

## 2024-06-26 NOTE — Telephone Encounter (Signed)
 I left messae on machine, letter be ready Monday, routing back for you to remember to do.

## 2024-06-28 ENCOUNTER — Encounter: Payer: Self-pay | Admitting: Physician Assistant

## 2024-06-28 NOTE — Progress Notes (Signed)
° °  12/Dec/2025   Dear Debbra or Madam:   This is to inform you that Ann Fowler  has been under my care in the Neurology clinic since 05/20/2024. She has a diagnosis of dementia due to Alzheimer's disease. She requires more supervision for safety. This patient's medical condition is highly unlikely to improve over time given the nature of the disease.    Respectfully, Camie Sevin, PA-C

## 2024-07-01 ENCOUNTER — Telehealth: Payer: Self-pay | Admitting: Physician Assistant

## 2024-07-01 NOTE — Telephone Encounter (Signed)
 Pt's husband lefted a message wanting to know is the letter ready he requested.  I did not see the letter in our folder. Thanks

## 2024-07-02 ENCOUNTER — Telehealth: Payer: Self-pay | Admitting: Physician Assistant

## 2024-07-02 NOTE — Telephone Encounter (Signed)
 Pt's husband Alm called and he stated that he is still waiting on the letter he request for pt. Alm stated that the hearing is tomorrow and he really needed that letter to take to the hearing. Thanks

## 2024-07-02 NOTE — Telephone Encounter (Signed)
 Patient husband advised Letter ready for pick up.

## 2024-07-02 NOTE — Telephone Encounter (Signed)
 Pt's husband lefted a message stating that he put in a request to get a letter written for Pt and he needs that letter today. Thanks

## 2024-07-23 ENCOUNTER — Other Ambulatory Visit (HOSPITAL_COMMUNITY): Payer: Self-pay

## 2024-08-01 ENCOUNTER — Other Ambulatory Visit (HOSPITAL_COMMUNITY): Payer: Self-pay

## 2024-08-02 ENCOUNTER — Other Ambulatory Visit (HOSPITAL_COMMUNITY): Payer: Self-pay

## 2024-08-03 ENCOUNTER — Other Ambulatory Visit (HOSPITAL_COMMUNITY): Payer: Self-pay

## 2024-08-03 MED ORDER — LEVOTHYROXINE SODIUM 75 MCG PO TABS
75.0000 ug | ORAL_TABLET | Freq: Every day | ORAL | 3 refills | Status: AC
  Filled 2024-08-03: qty 40, 40d supply, fill #0
  Filled 2024-08-03: qty 90, 90d supply, fill #0

## 2024-08-03 MED ORDER — ALPRAZOLAM 0.5 MG PO TABS
0.5000 mg | ORAL_TABLET | Freq: Two times a day (BID) | ORAL | 2 refills | Status: AC | PRN
  Filled 2024-08-03: qty 60, 30d supply, fill #0

## 2024-08-03 MED ORDER — ALBUTEROL SULFATE HFA 108 (90 BASE) MCG/ACT IN AERS
2.0000 | INHALATION_SPRAY | RESPIRATORY_TRACT | 5 refills | Status: AC | PRN
  Filled 2024-08-03: qty 6.7, 17d supply, fill #0
  Filled 2024-08-19: qty 6.7, 17d supply, fill #1

## 2024-08-04 ENCOUNTER — Other Ambulatory Visit (HOSPITAL_COMMUNITY): Payer: Self-pay

## 2024-08-04 MED ORDER — LEVOTHYROXINE SODIUM 100 MCG PO TABS
100.0000 ug | ORAL_TABLET | Freq: Every morning | ORAL | 5 refills | Status: AC
  Filled 2024-08-04: qty 30, 30d supply, fill #0

## 2024-08-05 ENCOUNTER — Other Ambulatory Visit (HOSPITAL_COMMUNITY): Payer: Self-pay

## 2024-08-06 ENCOUNTER — Other Ambulatory Visit (HOSPITAL_COMMUNITY): Payer: Self-pay

## 2024-08-07 ENCOUNTER — Other Ambulatory Visit (HOSPITAL_COMMUNITY): Payer: Self-pay

## 2024-08-08 ENCOUNTER — Other Ambulatory Visit (HOSPITAL_COMMUNITY): Payer: Self-pay

## 2024-08-08 MED ORDER — DONEPEZIL HCL 10 MG PO TABS: 10.0000 mg | ORAL_TABLET | Freq: Every day | ORAL | 0 refills | Status: AC

## 2024-08-08 MED ORDER — BREXPIPRAZOLE 2 MG PO TABS: 2.0000 mg | ORAL_TABLET | Freq: Every day | ORAL | 5 refills | Status: AC

## 2024-08-08 MED ORDER — REXULTI 0.5 MG PO TABS
ORAL_TABLET | ORAL | 4 refills | Status: AC
  Filled 2024-08-19: qty 60, 33d supply, fill #0

## 2024-08-09 ENCOUNTER — Other Ambulatory Visit (HOSPITAL_COMMUNITY): Payer: Self-pay

## 2024-08-13 ENCOUNTER — Other Ambulatory Visit (HOSPITAL_COMMUNITY): Payer: Self-pay

## 2024-08-15 ENCOUNTER — Other Ambulatory Visit (HOSPITAL_COMMUNITY): Payer: Self-pay

## 2024-08-19 ENCOUNTER — Other Ambulatory Visit (HOSPITAL_COMMUNITY): Payer: Self-pay

## 2024-08-19 MED ORDER — CYANOCOBALAMIN 1000 MCG/ML IJ SOLN
1000.0000 ug | INTRAMUSCULAR | 0 refills | Status: AC
  Filled 2024-08-19: qty 3, 90d supply, fill #0

## 2024-08-20 ENCOUNTER — Other Ambulatory Visit (HOSPITAL_COMMUNITY): Payer: Self-pay

## 2024-08-22 ENCOUNTER — Other Ambulatory Visit: Payer: Self-pay

## 2024-08-22 ENCOUNTER — Other Ambulatory Visit (HOSPITAL_COMMUNITY): Payer: Self-pay

## 2024-08-23 ENCOUNTER — Other Ambulatory Visit (HOSPITAL_COMMUNITY): Payer: Self-pay

## 2024-08-29 ENCOUNTER — Ambulatory Visit: Admitting: Physician Assistant
